# Patient Record
Sex: Female | Born: 1935
Health system: Southern US, Community
[De-identification: ages and names within clinical notes are randomized; demographics above are authoritative.]

## PROBLEM LIST (undated history)

## (undated) DIAGNOSIS — I480 Paroxysmal atrial fibrillation: Secondary | ICD-10-CM

## (undated) DIAGNOSIS — I10 Essential (primary) hypertension: Secondary | ICD-10-CM

## (undated) DIAGNOSIS — Z9289 Personal history of other medical treatment: Secondary | ICD-10-CM

## (undated) DIAGNOSIS — M810 Age-related osteoporosis without current pathological fracture: Secondary | ICD-10-CM

## (undated) HISTORY — DX: Age-related osteoporosis without current pathological fracture: M81.0

## (undated) HISTORY — DX: Paroxysmal atrial fibrillation: I48.0

## (undated) HISTORY — DX: Personal history of other medical treatment: Z92.89

## (undated) HISTORY — DX: Essential (primary) hypertension: I10

---

## 1997-11-02 ENCOUNTER — Other Ambulatory Visit: Admission: RE | Admit: 1997-11-02 | Discharge: 1997-11-02 | Payer: Self-pay | Admitting: Family Medicine

## 1999-07-24 ENCOUNTER — Ambulatory Visit (HOSPITAL_COMMUNITY): Admission: RE | Admit: 1999-07-24 | Discharge: 1999-07-24 | Payer: Self-pay | Admitting: Gastroenterology

## 1999-07-24 ENCOUNTER — Encounter (INDEPENDENT_AMBULATORY_CARE_PROVIDER_SITE_OTHER): Payer: Self-pay | Admitting: Specialist

## 2000-04-03 ENCOUNTER — Other Ambulatory Visit: Admission: RE | Admit: 2000-04-03 | Discharge: 2000-04-03 | Payer: Self-pay | Admitting: Family Medicine

## 2000-04-07 ENCOUNTER — Encounter: Admission: RE | Admit: 2000-04-07 | Discharge: 2000-04-07 | Payer: Self-pay | Admitting: Family Medicine

## 2000-04-07 ENCOUNTER — Encounter: Payer: Self-pay | Admitting: Family Medicine

## 2000-04-28 DIAGNOSIS — Z9289 Personal history of other medical treatment: Secondary | ICD-10-CM

## 2000-04-28 HISTORY — DX: Personal history of other medical treatment: Z92.89

## 2002-07-15 ENCOUNTER — Encounter: Admission: RE | Admit: 2002-07-15 | Discharge: 2002-07-15 | Payer: Self-pay | Admitting: Family Medicine

## 2002-07-15 ENCOUNTER — Encounter: Payer: Self-pay | Admitting: Family Medicine

## 2005-11-23 ENCOUNTER — Inpatient Hospital Stay (HOSPITAL_COMMUNITY): Admission: EM | Admit: 2005-11-23 | Discharge: 2005-12-05 | Payer: Self-pay | Admitting: Emergency Medicine

## 2005-12-01 ENCOUNTER — Ambulatory Visit: Payer: Self-pay | Admitting: Physical Medicine & Rehabilitation

## 2005-12-03 ENCOUNTER — Encounter: Payer: Self-pay | Admitting: Vascular Surgery

## 2005-12-23 ENCOUNTER — Ambulatory Visit (HOSPITAL_COMMUNITY): Admission: RE | Admit: 2005-12-23 | Discharge: 2005-12-23 | Payer: Self-pay | Admitting: Family Medicine

## 2006-04-05 ENCOUNTER — Inpatient Hospital Stay (HOSPITAL_COMMUNITY): Admission: EM | Admit: 2006-04-05 | Discharge: 2006-04-08 | Payer: Self-pay | Admitting: *Deleted

## 2006-04-06 ENCOUNTER — Ambulatory Visit: Payer: Self-pay | Admitting: Physical Medicine & Rehabilitation

## 2007-06-05 IMAGING — CT CT HEAD W/O CM
6 of 10 series · 18 of 47 positions shown, 19 images · IV contrast (agent unspecified)
Comparison: None.

CLINICAL DATA: Fell.
HEAD CT WITHOUT CONTRAST:
TECHNIQUE: Contiguous axial images were obtained from the base of the skull through the vertex according to standard protocol without contrast.
TECHNIQUE: Multidetector CT imaging of the cervical spine was performed.  Multiplanar CT  image reconstructions were also generated.

[Series 4: recon 3: brain · axial · 0.47mm/px · z∈[+182,+239]mm · 2 of 64 slices shown (1 of 2)]
[im 22/64  brain]
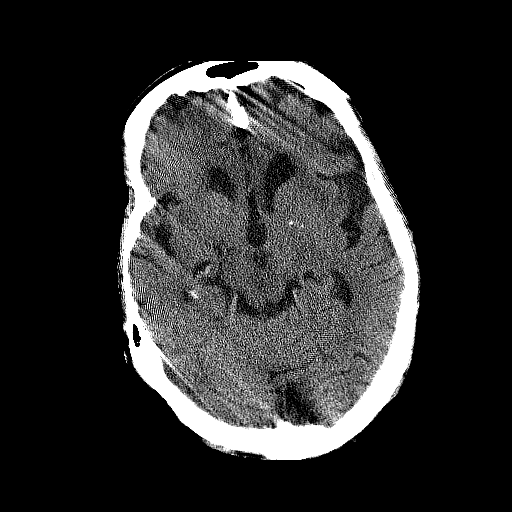
[im 43/64  brain]
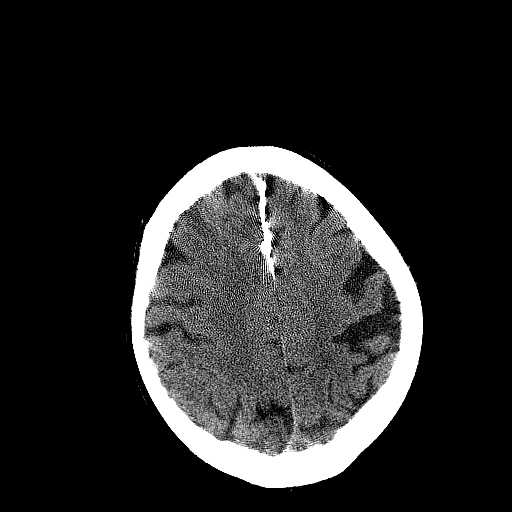

[Series 7: recon 3: brain · axial · 0.47mm/px · z∈[+157,+248]mm · 3 of 72 slices shown, 4 images (2 of 2)]
[im 18/72  brain]
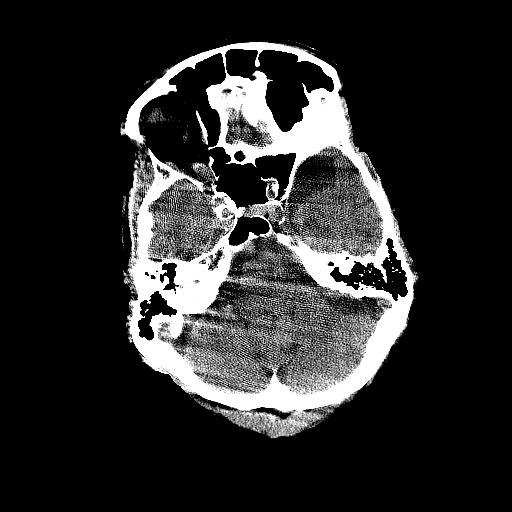
[im 18/72  bone]
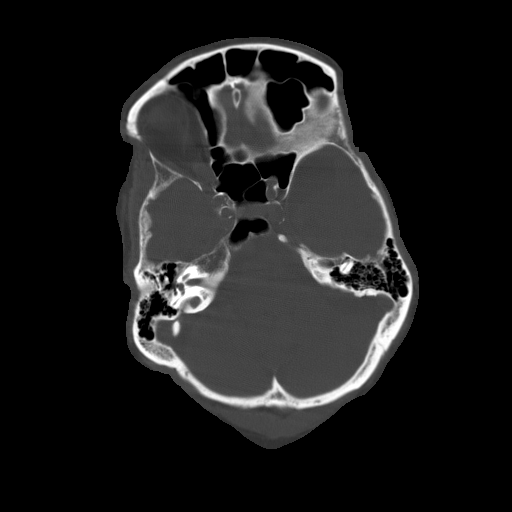
[im 36/72  brain]
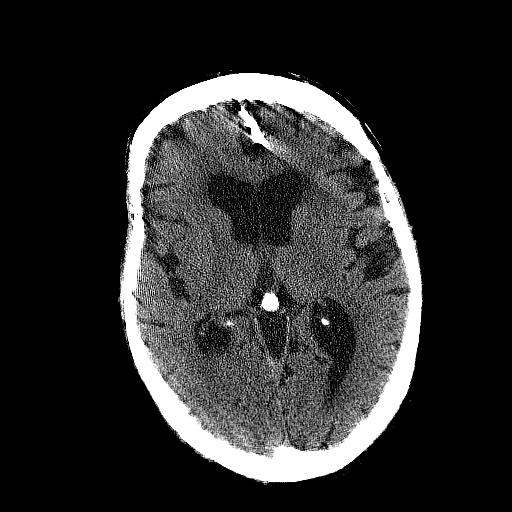
[im 54/72  brain]
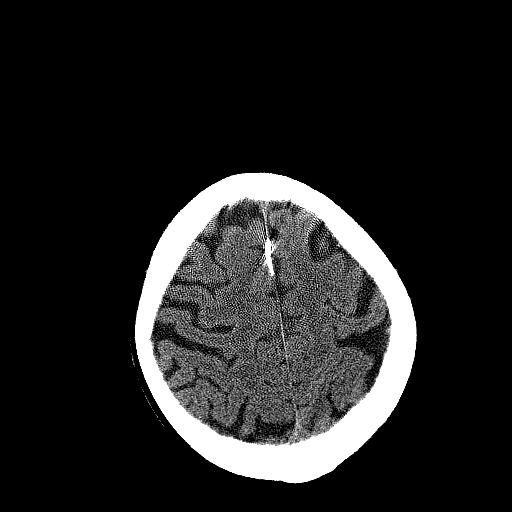

[Series 8: cervical spine · axial · 0.31mm/px · z∈[-37,+83]mm · 4 of 80 slices shown]
[im 16/80  brain]
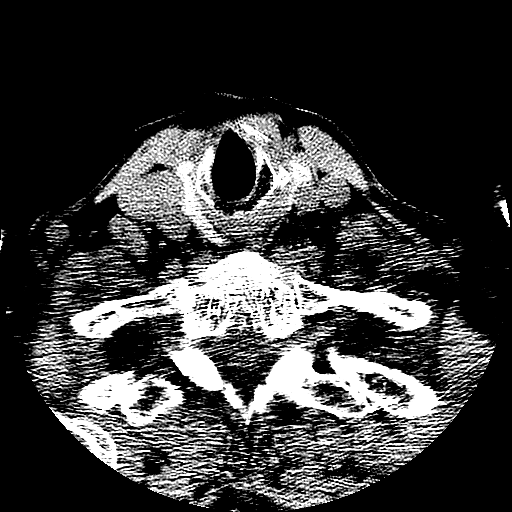
[im 32/80  brain]
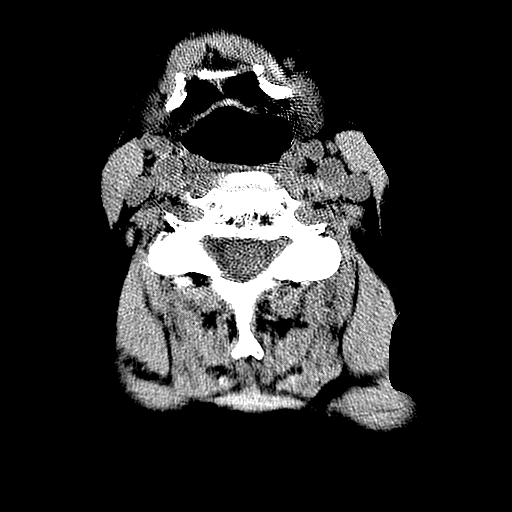
[im 48/80  brain]
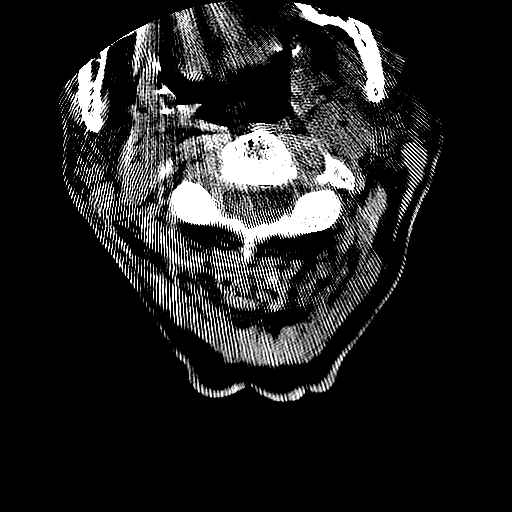
[im 64/80  brain]
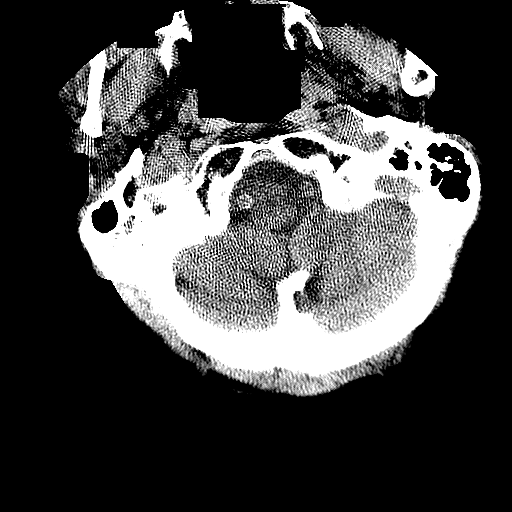

[Series 9: recon 2: cervical spine · axial · 0.31mm/px · z∈[-37,+43]mm · 3 of 80 slices shown]
[im 16/80  brain]
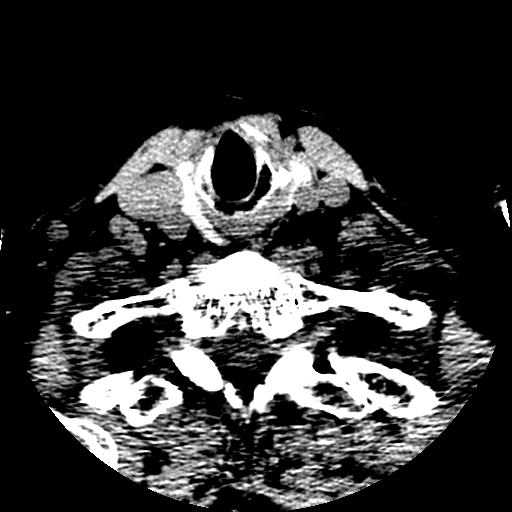
[im 32/80  brain]
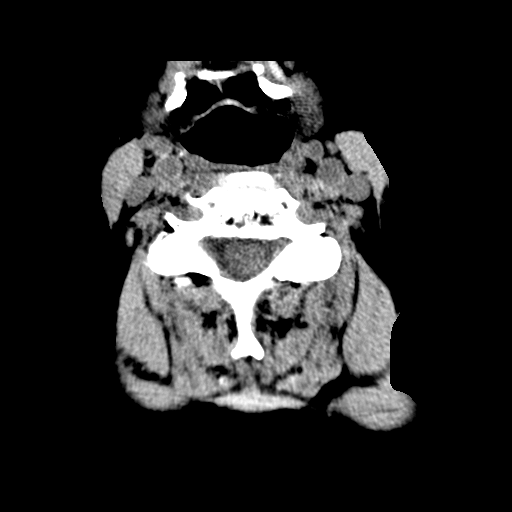
[im 48/80  brain]
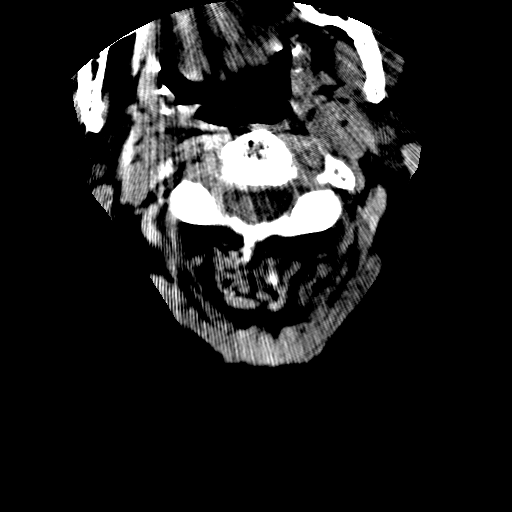

[Series 1000: reformatted · coronal · 0.40mm/px · 3 of 43 slices shown (1 of 2)]
[im 15/43  brain]
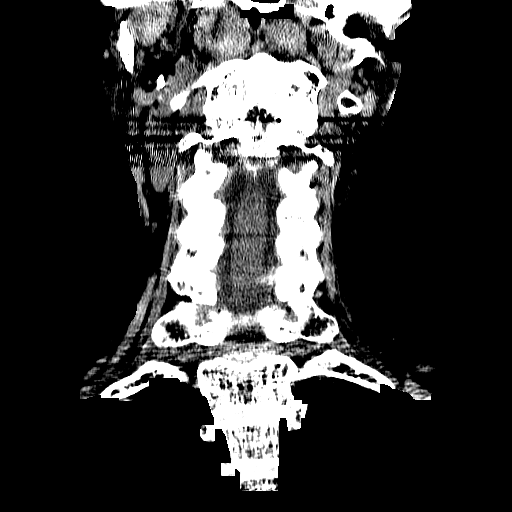
[im 19/43  brain]
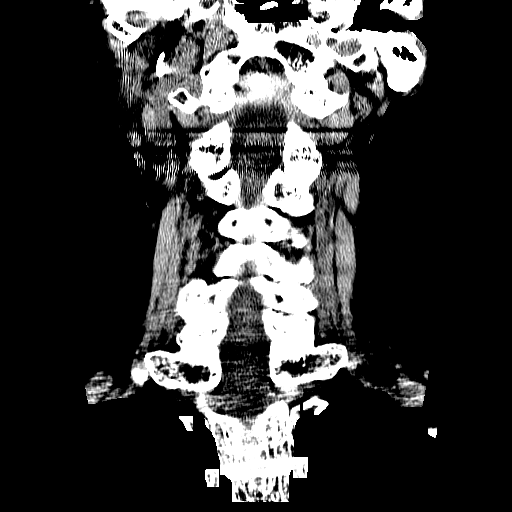
[im 24/43  brain]
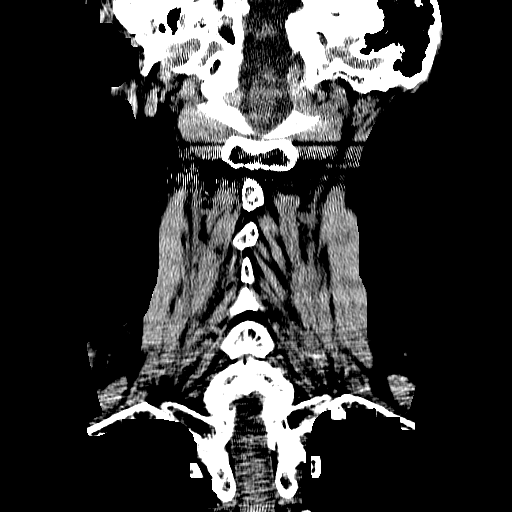

[Series 1001: reformatted · sagittal · 0.40mm/px · 3 of 40 slices shown (2 of 2)]
[im 14/40  brain]
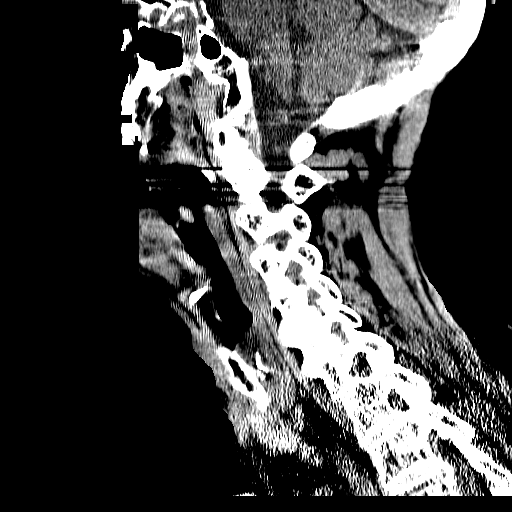
[im 20/40  brain]
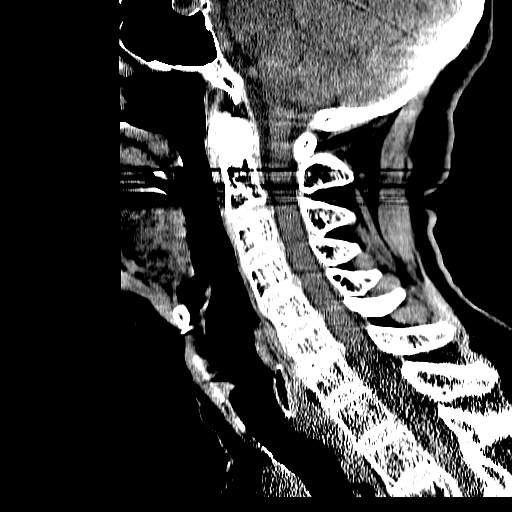
[im 27/40  brain]
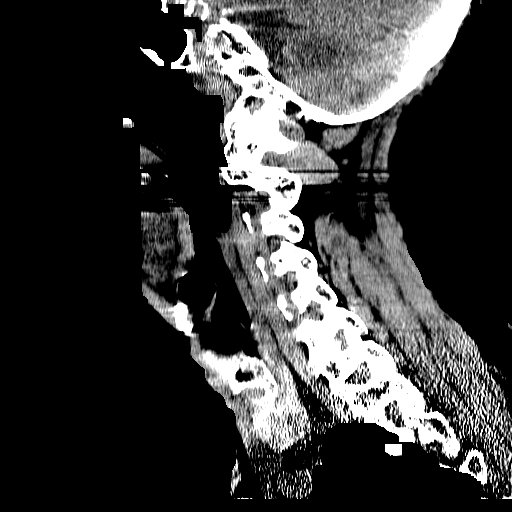

[18 of 47 positions shown; findings below may reference images not displayed]

FINDINGS: Atrophy with chronic microvascular ischemic change noted.  Bilateral basal ganglia calcification is physiologic.  There is no definite acute stroke, intracranial hemorrhage, or mass effect.  No extraaxial fluid collections are seen.  There is mild motion degradation, but I believe the calvarium is intact and the skull base is similarly free of fractures.  The paranasal sinuses are grossly clear as are the mastoids.  There is advanced vascular calcification throughout.
IMPRESSION: 1.  Atrophy with chronic microvascular ischemic change. 
2.  No definite acute intracranial findings.  
CERVICAL SPINE CT WITHOUT CONTRAST:
FINDINGS: Advanced skeletal osteopenia is present.  There is markedly coarsened trabeculae.  There is advanced degenerative change at C6-7.  There is no visible fracture or subluxation.  Prevertebral soft tissue swelling is present.  No intraspinal hematoma is seen.  There is a large uncinate spur at C5-6 on the left.  Smaller spurs are seen at C6-7 bilaterally.  Lung apices are clear.  There are no obvious neck masses.
IMPRESSION: 1.  No acute findings.  
2.  Degenerative disc disease.

## 2007-06-05 IMAGING — CR DG SHOULDER 2+V*L*
2 series · 2 of 2 positions shown · non-contrast
Comparison: none

CLINICAL DATA: Fall, left shoulder deformity.
 LEFT SHOULDER ? 2 VIEWS:

[view not recorded (1 of 2)]
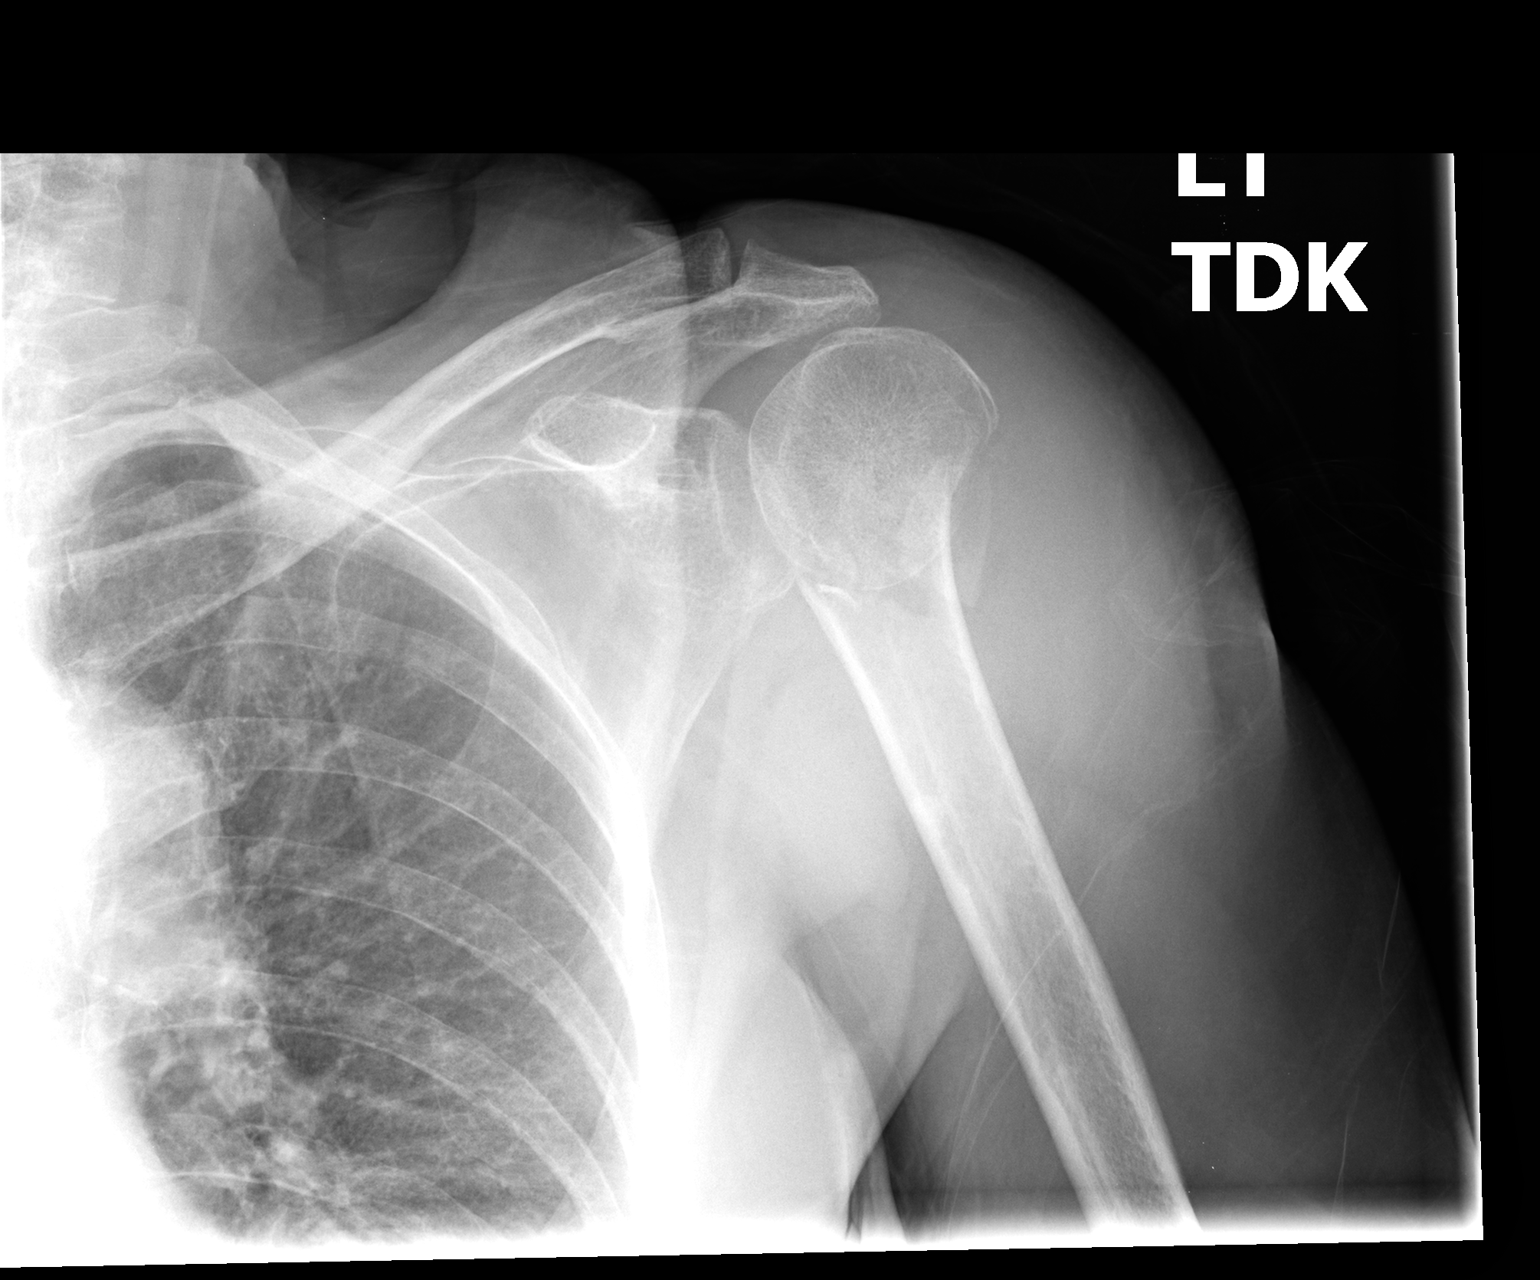

[view not recorded (2 of 2)]
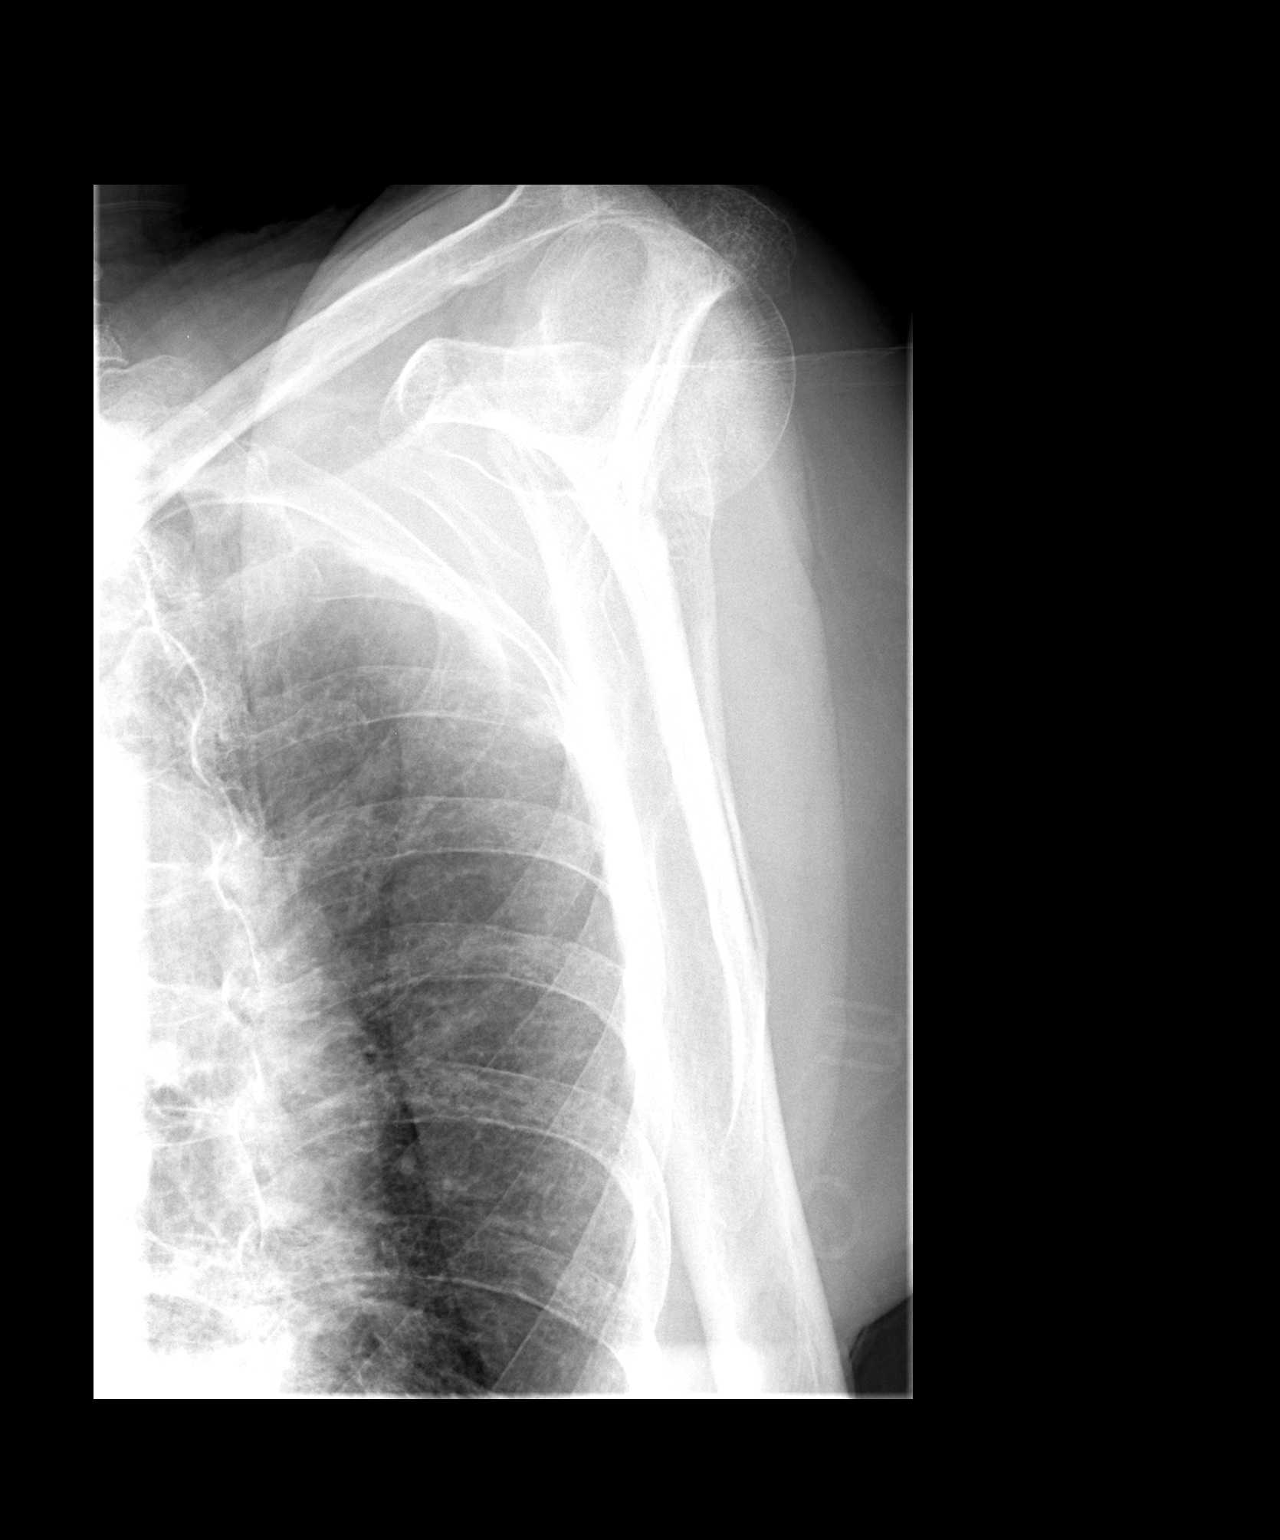

[2 of 2 positions shown; findings below may reference images not displayed]

FINDINGS: There is an impacted and possibly comminuted humeral neck fracture.  No dislocation is seen.  Osteopenia is present.  Adjacent ribs are intact.  Degenerative change is seen at the acromioclavicular joint.
IMPRESSION: Impacted and comminuted left humeral neck fracture without definite dislocation.

## 2007-09-17 ENCOUNTER — Encounter: Admission: RE | Admit: 2007-09-17 | Discharge: 2007-09-17 | Payer: Self-pay | Admitting: Family Medicine

## 2008-01-28 DIAGNOSIS — Z9289 Personal history of other medical treatment: Secondary | ICD-10-CM

## 2008-01-28 HISTORY — DX: Personal history of other medical treatment: Z92.89

## 2008-12-23 ENCOUNTER — Emergency Department (HOSPITAL_COMMUNITY): Admission: EM | Admit: 2008-12-23 | Discharge: 2008-12-23 | Payer: Self-pay | Admitting: Emergency Medicine

## 2009-08-14 ENCOUNTER — Encounter: Admission: RE | Admit: 2009-08-14 | Discharge: 2009-08-14 | Payer: Self-pay | Admitting: Orthopedic Surgery

## 2010-11-01 NOTE — Procedures (Signed)
[name]. [facility]  Patient:    [name], [name]                   MRN: [id number] Proc. Date: 07/24/99 Adm. Date:  [id number] Attending:  Rich Brave CC:         Talmadge Coventry, M.D.                           Procedure Report  PROCEDURE PERFORMED:  Upper endoscopy.  ENDOSCOPIST:  Florencia Reasons, M.D.  INDICATIONS FOR PROCEDURE:  The patient is a 77 year old female on aspirin with  heme positive stool and mild reflux symptoms.  FINDINGS:  Essentially normal exam.  No aspirin induced gastropathy.  DESCRIPTION OF PROCEDURE:  The nature, purpose and risks of the procedure had been discussed with the patient, who provided written consent.  Sedation was fentanyl 60 mcg and Versed 6 mg IV without arrhythmias or desaturation.  The Olympus video endoscope was passed under direct vision.  The esophagus waas entered without much difficulty.  The esophageal mucosa was normal, specifically without evidence of reflux esophagitis or Barretts mucosa.  No varices, infection, neoplasia, ring, stricture or hiatal hernia was appreciated.  There did appear to be some slight spasm in the distal esophagus making it slightly difficult to pass the scope through that area, but no discrete ring or stricture could be appreciated.  The stomach was entered.  It contained no significant residual.  There were a couple of tiny mucosal hemorrhages in the antrum of the stomach but no erosive changes.  There was some faint erythema of the gastric antrum in a watermelon pattern but this was not felt to really be pathologic or significant.  No source of heme positivity was identified, specifically, no erosions or ulcers, nor did I ee any polyps or masses including a retroflex view of the proximal stomach which was normal.  It should be recalled, however, that the patient had been off her aspirin for about five days by the time of this exam, thus  allowing potentially an opportunity for aspirin related mucosal inflammation to improve.  The pylorus, duodenal bulb and second duodenum looked normal.  The scope was removed from the patient who tolerated the procedure well and without apparent complication.  IMPRESSION: 1. Essentially normal upper endoscopy. 2. No source of heme positive stool identified, but suspect that transient    gastric inflammation from aspirin use could be occurring since the patient    had been off aspirin for five days at the time of this procedure. 3. No definite source of dysphagia identified but "tight" muscle in the distal    esophagus may be playing a role in that symptom which is not too bothersome    for the patient. 4. The patient has some mild reflux symptoms but I did not see evidence of reflux    induced esophageal mucosal injury.  PLAN:  Proceed to colonoscopic evaluation.  May continue to use Pepcid p.r.n. for reflux. DD:  07/24/99 TD:  07/24/99 Job: [id number]

## 2010-11-01 NOTE — Procedures (Signed)
[place]. [facility]  Patient:    [name], [name]                   MRN: [id number] Proc. Date: 07/24/99 Adm. Date:  [id number] Attending:  Rich Brave CC:         Talmadge Coventry, M.D.                           Procedure Report  PROCEDURE PERFORMED:  Colonoscopy with polypectomy.  ENDOSCOPIST:  Florencia Reasons, M.D.  INDICATIONS FOR PROCEDURE:  The patient is a 77 year old female with heme positive stool noted through her primary physicians office. She is several years status post colonoscopic polypectomy.  FINDINGS:  Medium sized polyp removed from the midcolon and tiny rectal polyp present.  DESCRIPTION OF PROCEDURE:  The nature, purpose and risks of the procedure were familiar to the patient from prior examination and she provided written consent. This procedure was performed immediately following her upper endoscopy for which she had received sedation with fentanyl 60 mcg and Versed 6 mg IV without arrhythmias or desaturation during either procedure.  No additional sedation was required for this procedure.  The Olympus adult video colonoscope was advanced around the colon to the area just above the cecum.  There was quite a bit of looping which could not be overcome ven with external abdominal compression and placing the patient in the supine and right lateral decubitus positions.  I used a biopsy forceps to help tease the mucosa f the base of the cecum into a more visible orientation so it is felt that most of the cecal surface area was able to be seen and that no significant mass lesions  would have been missed in that area.  Pullback was then performed.  During insertion of the scope, I encountered a medium-sized (5 x 6 mm) polyp in the region of the midcolon, perhaps near the splenic flexure.  This was removed by snare technique with complete hemostasis nd no evidence of excessive cautery.  I had about 80 cm of  scope insertion at the ime of the polypectomy but due to looping, the anatomic correlation of this location is unclear and I did not see the eschar site during pullback.  There was also a tiny sessile polyp in the midrectum at about 8 cm removed by a  single cold biopsy.  No evidence of cancer, colitis or vascular malformations were observed.  There was some slight sigmoid diverticulosis.  Retroflexion in the rectum disclosed no distal rectal abnormalities.  The patient tolerated the procedure well and there were no apparent complications.  IMPRESSION: 1. Colon polyps removed as described above. 2. Sigmoid diverticulosis. 3. Suboptimal visualization of the cecum.  PLAN:  Await pathology on the polyp.  Would anticipate colonoscopic follow-up probably about three years from now. DD:  07/24/99 TD:  07/24/99 Job: [id number]

## 2010-11-01 NOTE — Consult Note (Signed)
[name], [name]            ACCOUNT NO.:  [id number]   MEDICAL RECORD NO.:  [id number]          PATIENT TYPE:  INP   LOCATION:  5003                         FACILITY:  [facility]   PHYSICIAN:  Lonia Blood, M.D.      DATE OF BIRTH:  1923/03/14   DATE OF CONSULTATION:  11/27/2005  DATE OF DISCHARGE:                                   CONSULTATION   PRIMARY CARE PHYSICIAN:  Dr. Talmadge Coventry.   CONSULT REQUESTED BY:  Dr. Durene Romans.   REASON FOR CONSULTATION:  Hypoxia and for medical management.   HISTORY OF PRESENT ILLNESS:  The patient is an 77 year old white female that  apparently slid and fell on November 23, 2005, and sustained a left displaced  femoral neck fracture.  She was admitted by orthopedics and had a left  hemiarthroplasty performed.  The patient has been doing fine afterwards and  was mainly pain control in the beginning.  Since yesterday, however, she was  noted to have some elements of hypoxia.  She had initial workup including a  chest x-ray and chest CT that suggests bilateral lower lobe collapse versus  consolidation, also some mild pleural effusion.  With that, we have been  asked to consult on the patient.  She is currently doing better in terms of  her breathing.  She is non-having any labored breathing and no chest pain.  She denied any cough.  The patient also denied any chest pain and no fever  at this point.  She is still feeling not too good.  She has been off pain  medicine for the most part.   PAST MEDICAL HISTORY:  Significant for hypertension, osteoporosis, anxiety  disorder and of the left femoral fracture.   ALLERGIES:  THE PATIENT IS ALLERGIC TO BACTRIM AND PHENOBARBITAL.   MEDICATIONS:  Currently on:  1.  Aspirin 81 mg daily.  2.  She is on Coumadin for DVT prophylaxis.  3.  Colace 100 mg b.i.d.  4.  Ferrous sulfate 325 mg t.i.d.  5.  Hydrochlorothiazide 25 mg daily.  6.  Benazepril 20 mg daily.  7.  Pimelo 75 mg q.h.s.  8.  Lorcet  p.r.n. 500 mg.   SOCIAL HISTORY:  The patient is married and lives with her husband.  Her  husband was a former smoker to quit about 30 years ago.  Patient has never  smoked although she has been exposed to secondhand smoke.  Usually, very  functional and a very active prior to this.  Also, no alcohol use.   FAMILY HISTORY:  Mother died at age of 41 from Alzheimer's.  Father died at  the age of 109 from Bright's disease.   REVIEW OF SYSTEMS:  A 12-point review of systems is performed a day and is  significant only for some diarrhea this morning.  Otherwise, by HPI.   EXAMINATION:  VITAL SIGNS:  On her exam today, temperature is 98.  Of note,  she has had a temperature of 101.2 back on June 12.  Her blood pressure  ranges between 91-124 systolic with a diastolic of 52.  Pulse rate  110-120.  Respiratory rate 20.  Her sats currently are 97% on 2 L up from 92% on 2 L 2  days ago.  GENERALLY:  The patient is awake, alert and oriented in no acute distress.  HEENT:  PERRL.  EOMI.  NECK:  Supple.  No JVD.  No lymphadenopathy.  RESPIRATORY:  The patient has good air entry bilaterally.  No wheezes.  No  rales.  CARDIOVASCULAR SYSTEM:  She has a regular rate and rhythm.  ABDOMEN:  Soft and non-tender with positive bowel sounds.  EXTREMITIES:  Left lower extremity status post surgery and immobilized.  No  edema, cyanosis or clubbing.   LABORATORY:  Currently, no labs today.  The patient's white count yesterday  was 21.6, hemoglobin 9.3 and platelet count 197.  No differentials checked.  Sodium was 128, potassium 3.3, chloride 92, CO2 26, glucose 162, BUN 12,  creatinine 1.1 and calcium 7.8.  Chest x-ray performed today showed possible  COPD, emphysema and lower lung volumes.  There was a right mid-lung and left  apical sub-segmented atelectasis.  A chest CT without contrast today showed  no pulmonary lower low collapse versus consolidation, also bilateral pleural  effusions.   ASSESSMENT:   Therefore, an 77 year old female with sudden onset of hypoxia,  which has been improving right now.  This happened postoperatively and at  the time when patient was probably on some pain medications.  Her symptoms  have since improved tremendously.  I, therefore, suspect that patient's  symptoms are more likely poor inspiratory efforts both from her use of  medications and postoperatively.  Pneumonia, however, cannot be ruled out  especially with patient having a temperature of 101.2, as well as her  symptoms.  Other medical problems included hypokalemia, hyponatremia and  anemia blood loss.   PLAN AND RECOMMENDATIONS:  Therefore will be:  1.  Begin empiric antibiotics with Levaquin probably.  At the same time, I      agree with drawing blood cultures.  We may check UA also on patient.      Once studies performed, continue with incentive spirometry and chest x-      ray.  2.  I recommend p.r.n. nebulizers while in the hospital.  Also, replete her      potassium and give gentle hydration.  We may consider stopping her      hydrochlorothiazide at this point because of her hyponatremia and to use      other means of treatment for her high blood pressure.  3.  Would also follow up with you while patient is in the hospital.   Thank you for the consult.      Lonia Blood, M.D.  Electronically Signed     LG/MEDQ  D:  11/27/2005  T:  11/27/2005  Job:  [id number]   cc:   Talmadge Coventry, M.D.  Fax: [phone]

## 2010-11-01 NOTE — H&P (Signed)
[name], [name] NO.:  [id number]   MEDICAL RECORD NO.:  [id number]          PATIENT TYPE:  INP   LOCATION:  5023                         FACILITY:  [facility]   PHYSICIAN:  Della Goo, M.D. DATE OF BIRTH:  03/01/1923   DATE OF ADMISSION:  04/05/2006  DATE OF DISCHARGE:                                HISTORY & PHYSICAL   CHIEF COMPLAINT:  Left hip and left shoulder pain status post fall.   HISTORY OF PRESENT ILLNESS:  [age]year-old female who was brought to  the emergency department after suffering a fall the evening of admission.  She reports walking and reaching out to open a door and lost her balance and  fell onto her left side, injuring her left shoulder and left hip.  Patient  has a history of a left hip hemiarthroplasty performed in June of 2007,  following a previous femoral neck fracture at that time.  Patient denies  having any chest pain, shortness of breath, dizziness, syncope, nausea,  vomiting, diarrhea, fevers or chills associated with this event.   PAST MEDICAL HISTORY:  As mentioned above.  Also, hypertension and  osteoporosis history.   MEDICATIONS:  Include:  1. Lotensin 5 mg one p.o. daily.  2. Aspirin 325 mg one p.o. daily.   ALLERGIES:  TO:  1. PHENOBARBITAL CAUSING ANAPHYLAXIS.  2. BACTRIM CAUSING RASH.   SOCIAL HISTORY:  Patient lives in the home of her adult son.  She is a  nonsmoker, nondrinker.   PHYSICAL EXAMINATION FINDINGS:  GENERAL:  [age]-old well-  nourished, well-developed female in discomfort, but in no acute distress.  VITAL SIGNS:  On admission revealed a temperature of 97.2, blood pressure  173/86, heart rate 90, respirations 18.  O2 saturations is 92%.  HEENT:  Normocephalic, atraumatic.  No palpable scalp hematomas or nodules.  Pupils equally round and reactive to light.  Extraocular muscles are intact.  No scleral icterus.  Oropharynx clear.  Poor dentition.  NECK:  Supple.  Full range of  motion.  No thyromegaly, adenopathy or jugular  venous distention.  CARDIOVASCULAR:  Regular rate and rhythm.  LUNGS:  Clear to auscultation bilaterally.  ABDOMEN:  Positive bowel sounds.  Soft, nontender, nondistended.  EXTREMITIES:  Without edema.  Dorsal pedal pulses intact bilaterally, 2+.  Patient has full range of motion, however, does have pain with movement of  her left shoulder and left hip.   LABORATORY STUDIES:  Revealed a hemoglobin of 13.3, hematocrit 39.0, sodium  140, potassium 3.8, chloride 109, bicarbonate 21.7, BUN 21, creatinine 0.9,  glucose 99.  Protime 14.3, INR 1.1.  Pelvis x-ray revealed a fibroid uterus,  which is introverted.  X-rays of the left hip reveal an antecub inferior  ramus fracture.  X-rays of the left elbow are without evidence of fracture.  X-ray of the left shoulder reveals a left humeral neck fracture, which is  nondisplaced an comminuted.  CT of the head reveals no acute findings of  intracranial hemorrhage.  However, it does reveal changes of atrophy and  small vessel changes.   ASSESSMENT:  [age]year-old female who is status post fall with  acute  fractures of the left hip and left humerus.   ADMITTING DIAGNOSES:  Include:  1. Left hip fracture, inferior ramus fracture.  2. Left humeral fracture.  3. History of hypertension.  4. History of osteoporosis.   PLAN:  Patient will be admitted for conservative therapy as per the  orthopedic evaluation performed by Dr. Turner Daniels.  Pain control therapy will be  initiated with IV Dilaudid p.r.n.  Patient will also be placed on GI and DVT  prophylaxis and will continue on her regular medications.  Her blood  pressure is elevated, at this time, which is thought to be reactive to pain.  However, a p.r.n. clonidine order will be given.      Della Goo, M.D.  Electronically Signed     HJ/MEDQ  D:  04/05/2006  T:  04/05/2006  Job:  [id number]   cc:   Talmadge Coventry, M.D.

## 2010-11-01 NOTE — H&P (Signed)
NAMEJAZMAINE, [name] NO.:  [id number]   MEDICAL RECORD NO.:  [id number]          PATIENT TYPE:  EMS   LOCATION:  MAJO                         FACILITY:  [facility]   PHYSICIAN:  Madlyn Frankel. Charlann Boxer, M.D.  DATE OF BIRTH:  09-Mar-1923   DATE OF ADMISSION:  11/23/2005  DATE OF DISCHARGE:                                HISTORY & PHYSICAL   PRINCIPAL DIAGNOSIS:  Left displaced femoral neck fracture.   SECONDARY DIAGNOSES:  1.  Hypertension.  2.  Anxiety.  3.  Osteoporosis.   HISTORY OF PRESENT ILLNESS:  [name] is a very pleasant 77 year old  female who is here in the emergency room for a fall that she sustained when  she was at [name]'s graduation at [place], being carried out at the  current [employer].  She stumbled on a last step and is uncertain  exactly what happened but no evidence of any shortness of breath, blacking  out, or dizziness prior to the fall.  She landed more on Ashley left buttock  area as opposed to Ashley left side and did not hurt Ashley wrist, shoulder, or  elbow.  Does have a little bit of back pain.  This was seen and evaluated by  the emergency room physician.  No radiographs were ordered.   Nonetheless, she had immediate onset of pain in the left hip with the  inability to bear weight and was brought to the emergency room by Ashley  family.  Ashley daughter-in-law is a Engineer, civil (consulting) at the [employer] [facility] [employer] [facility] outpatient  surgical center.  Ashley primary complaint is of pain in Ashley left proximal  thigh.  Again, no other injuries reported other than the back pain.   PAST MEDICAL HISTORY:  1.  Hypertension.  2.  Anxiety.  3.  Osteoporosis.   FAMILY HISTORY:  Noncontributory.  Ashley mother did live until she was 26 and  had a history of Alzheimer's since age 84.  Father died at 54 for Bright's  disease.   SOCIAL HISTORY:  She currently lives with Ashley husband.  She is very  functional, performing normal activities, including aerobics.  She has been  doing  that for six years.  No smoking or alcohol history.   DRUG ALLERGIES:  1.  BACTRIM.  2.  PHENOBARBITAL.   CURRENT MEDICATIONS:  Hydrochlorothiazide, aspirin, nortriptyline, Actonel,  and one other antihypertensive which she cannot recall the name of.   PRIMARY CARE PHYSICIAN:  Talmadge Coventry, M.D.   REVIEW OF SYSTEMS:  Otherwise negative for HPI issues.   PHYSICAL EXAMINATION:  VITAL SIGNS:  Blood pressure 156/80, pulse 86,  temperature 97.9, respirations 20.  GENERAL:  [name] is a very pleasant 77 year old female, relatively  comfortable in the bed other than the pain in the left hip.  She is awake,  alert and oriented.  HEENT:  No slurred speech.  No ocular dysfunction.  NECK:  Supple.  CHEST:  Clear to auscultation bilaterally.  HEART:  Regular rate and rhythm.  No murmurs.  ABDOMEN:  Soft and nontender.  EXTREMITIES:  Examination of the lower extremities reveals she has pain with  any external or internal rotation of the left hip.  This is limited.  She  otherwise is neurovascularly stable.  The right hip is normal.  BACK:  Examination of Ashley back was carried out to the ER and deferred to  later examination from my standpoint until the hip is fixed.   Radiographs revealed that she has a displaced varus angulated femoral neck  fracture on the left.  She has osteoporosis based on bone quality and  fracture.   Labs were pending from the operating room.   IMPRESSION:  Left displaced femoral neck fracture.   PLAN:  I reviewed with [name] and Ashley family, including Ashley daughter-  in-law and sons, the diagnosis and treatment options.  The treatment options  in an 77 year old female would be the possibility of hemiarthroplasty versus  total hip replacement.  Based on our discussion today, including the risks  and benefits of infection, dislocation, DVT, persistent discomfort, or  acetabular pain, the plan would be to proceed with a cemented left hip   hemiarthroplasty.  I do feel that this would provide Ashley with a good long  term result and allow Ashley to function sooner than later.  I discussed the  postoperative course and expectations.  She is very motivated to return back  to Ashley normal state of health and life.  I think that this will be the best  way to do this.   We will also evaluate for any potential persistent thoracic lumbar pain in  the form of compression fracture if she has persistent discomfort.  Home  medications will be obtained and provided to the nursing staff to the home  medical reconciliation form.  She will receive Ancef prior to the OR, and  consent will be obtained based off Ashley discussion here.      Madlyn Frankel Charlann Boxer, M.D.  Electronically Signed     MDO/MEDQ  D:  11/23/2005  T:  11/23/2005  Job:  [id number]   cc:   Talmadge Coventry, M.D.  Fax: [phone]

## 2010-11-01 NOTE — Discharge Summary (Signed)
[name], [name] NO.:  [id number]   MEDICAL RECORD NO.:  [id number]          PATIENT TYPE:  INP   LOCATION:  5023                         FACILITY:  [facility]   PHYSICIAN:  Michaelyn Barter, M.D. DATE OF BIRTH:  Jul 09, 1922   DATE OF ADMISSION:  04/04/2006  DATE OF DISCHARGE:  04/08/2006                                 DISCHARGE SUMMARY   FINAL DIAGNOSES:  1. Falling.  2. Impacted and comminuted left humeral neck fracture without definite      dislocation  3. Left inferior pubic ramus fracture, likely acute.   SECONDARY DIAGNOSIS:  Recent left hip fracture.   PROCEDURES.:   1. X-ray of the left elbow and shoulder, completed on      October20,2007.  2. A CT scan of the head without contrast, completed October20,2007.  3. CT scan of the spine without contrast, October20,2007.  4. X-ray of the hip, left side, October20,2007.  5. X-ray of the pelvis, completed October20,2007.   CONSULTATIONS:  Orthopedic surgery.   HISTORY OF PRESENT ILLNESS:  Ms. Worm is an 77 year old female who fell  on the date of admission.  She arrived with a chief complaint of left hip  and left shoulder pain following her fall.  She stated that she had walked  and stopped open a door and lost her balance, and fell onto her left side,  injuring her left shoulder and left hip.   PAST MEDICAL HISTORY:  Please see that dictated by Dr. Della Goo on  October21,2007.   HOSPITAL COURSE:  1. Falling.  The patient stated that she hit her head in the fall.  She      essentially lost balance.  She typically uses a cane to ambulate,      however, on the date of her admission she did not use her cane.  A CT      scan of the patient's head was completed without contrast on April 04, 2006.  It revealed atrophy with chronic microvascular ischemic      change.  No definite acute intracranial findings were seen.  CT scan of      the cervical spine was also done and it revealed no  acute findings,      degenerative disk disease was seen.  2. Impacted and comminuted left humeral neck fracture without definite      dislocation.  The patient complained of some left-sided pain.  An x-ray      was completed of the left shoulder and it confirmed and impacted and      comminuted left humeral neck fracture.  Orthopedic surgery was      consulted and they recommended conservative therapy.  They also      recommended non-weightbearing to the left upper extremity.  Therefore,      the patient was treated with p.r.n. pain medications.  3. Left inferior pubic ramus fracture.  This was confirmed on x-rays of      the patient's hip, again orthopedic surgery evaluated this.  They      indicated that weightbearing with physical therapy  should be done      regarding the left lower extremity.  They stated that the patient      should use a walker with assistance.  Physical therapy has followed the      patient throughout the course of her hospitalization and they should      continue to do so once she is discharged from the hospital.  4. Hypertension.  This has been stable throughout the course of the      hospitalization.  Today, October24, 2007, the patient indicates that      she feels pretty good.  She states that she has some pain in her left      hip, particularly when she tries to apply weight to that area.      Otherwise, she does not have any other complaints.   VITAL SIGNS:  Today, her temperature is 97.4, heart rate 97, respirations  20, blood pressure 125/71, O2 sat is 94% on room air.   The decision has been made to discharge the patient from the hospital.   The patient's medications at the time of discharge will consist of:  1. Aspirin 81 mg p.o. every day.  2. Lotensin 5 mg p.o. every day.  3. Pamelor 75 mg p.o. q.h.s.  4. Lovenox 40 mg subcu every day.  5. Protonix 40 mg p.o. every day.  6. Senokot-S 1 tablet p.o. q.h.s.  7. Tylenol 650 mg p.o. q.4 h p.r.n.  8.  Oxycodone 5 mg p.o. q.4 h p.r.n.  9. Ambien 5 mg p.o. q.h.s.   The patient also had a urinalysis completed on October23, 2007,  confirming the presence of a urinary tract infection.  Therefore, she will  be started on levofloxacin 500 mg p.o. every day.  She should take this for  the next 7 days with today being the first day.      Michaelyn Barter, M.D.  Electronically Signed     OR/MEDQ  D:  04/08/2006  T:  04/08/2006  Job:  [id number]

## 2010-11-01 NOTE — Discharge Summary (Signed)
[name], [name] NO.:  [id number]   MEDICAL RECORD NO.:  [id number]          PATIENT TYPE:  INP   LOCATION:  5012                         FACILITY:  [facility]   PHYSICIAN:  Madlyn Frankel. Charlann Boxer, M.D.  DATE OF BIRTH:  12-Jun-1923   DATE OF ADMISSION:  11/23/2005  DATE OF DISCHARGE:  12/05/2005                                 DISCHARGE SUMMARY   ADMISSION DIAGNOSES:  1.  Left displaced femoral neck fracture.  2.  Hypertension.  3.  Anxiety.  4.  Osteoporosis.   DISCHARGE DIAGNOSES:  1.  Left displaced femoral neck fracture.  2.  Hypertension.  3.  Anxiety.  4.  Osteoporosis.  5.  Postoperative anemia treated with transfusion.  6.  Hyponatremia.  7.  Hypokalemia.  8.  Metabolic acidosis secondary to hyperventilation.  9.  Urinary tract infection.  10. Basilar atelectasis by chest x-ray.   OPERATION:  On 11/23/2005 the patient underwent left hip hemiarthroplasty  with component cemented.   No assistant.   BRIEF HISTORY:  This 77 year old lady was at her grandson's graduation from  school when she took a misstep on the stairs.  She fell to the left.  Her  family brought her to the emergency room, and x-rays showed a femoral neck  fracture.  She had been in relatively good health prior to this injury  except for hypertension and anxiety.  I discussed with the family, as well  as the patient, with the risks and benefits of surgery, and it was decided  to go ahead with the above procedure.   COURSE IN THE HOSPITAL:  The patient tolerated the surgical procedure quite  well.  She was placed on Coumadin postoperatively for prevention of DVT.  As  she was doing well, it was thought that she could be managed at home.  It  was noted that she did have a mild hypokalemia early on in her postoperative  day number 3.  On 11/26/2005 the patient had a drop in her pO2 at 84-86.  We  increased the O2, and we increased the IVs.  Unfortunately, the patient had  increasing  shortness of breath with hypoxia and hyponatremia and hypokalemia  and some confusion.  A UTI was also noted.  We asked for and received a  consult from the hospitalist, Dr. Mikeal Hawthorne.  He and his team continued to  follow the patient throughout her hospitalization.  She eventually was  transfused for her postoperative anemia, and this improved it somewhat.  Fluid restriction was done for the hyponatremia and the hypokalemia  responded to p.o. potassium.  She continued with the hyponatremia despite  our restricting fluids and liberalizing salt intake.  She had various  degrees of confusion.  She was placed on vancomycin for the urinary tract  infection.   Orthopedically, the patient was quite stable.  Her wound was clean and dry.  Neurovascularly, she was intact to the operated extremity.  She had only a  minimal amount of serous drainage from one part of the wound.  It was  thought she might benefit with an inpatient rehab admission; however she  was  improving at actually working with physical therapy, and she was eventually  ambulating in her room.  Dr. Lavera Guise saw the patient prior to discharge.  Felt  to continue with the Coumadin.  Was doing fair on room air.  The  leukocytosis he suspected to be a demargination.  It was felt that she did  have some mild bronchitis and was going to have her stay on Levaquin for 5  days after discharge.  Hyponatremia was be treated with continuing water  restriction and regular diet.  She will resume her antihypertensive  medications at discharge.  Dr. Lavera Guise felt that the patient could be  discharged as far as the [facility] hospitalist were concerned.  We certainly  appreciate their health.   Again, Orthopedically the patient was doing quite well as far as her overall  orthopedic status was concerned.   We will have her follow up with her family physician, Dr. Talmadge Coventry, after her discharge.   STUDIES:  Electrocardiogram showed sinus  bradycardia.  Chest x-ray showed  mild basilar atelectasis.  No pneumonia was noted.  Spiral CT showed no  acute pulmonary embolus.  Incidentally, the chest x-ray showed a T8  compression fracture, as well.   LABORATORY EVALUATION:  Laboratory evaluation in the hospital showed a  preoperative hemoglobin at 13.9 and hematocrit was 41.  This dropped down to  8.6 and after transfusion came up to 10.5.  Her final hemoglobin was 11.3  with hematocrit of 32.7, and white count with the leukocytosis as mentioned  above at 21.4.  Blood chemistry showed a preoperative sodium at 138,  potassium was 3.3.  Final sodium was 131, potassium 4.6.  Creatinine was  normal at people .7, and calcium was 8.1.  CK was 297, CK-MB was 14.1;  relative index was 4.7.  Troponin 0.04.  TSH was 3.909.  Urinalysis done on  11/26/2005 showed urinary tract infection, granular cast, many bacteria,  trace leukoesterase.  Blood cultures all with no growth x5 days.   CONDITION ON DISCHARGE:  Improved, stable.   PLAN:  The patient may participate in any activity she desires.  She may  weight bear as tolerated.  She is to continue with her pain medications in a  mild amount.  Continue with Coumadin protocol.  She is to follow up with Dr.  Talmadge Coventry per her instructions.  He turned to see Dr. Charlann Boxer about 2-  3 weeks after the date of surgery.   DICTATED BY:  Jenne Campus, P.A.      Madlyn Frankel Charlann Boxer, M.D.  Electronically Signed     MDO/MEDQ  D:  01/02/2006  T:  01/02/2006  Job:  [id number]   cc:   Dr. Talmadge Coventry

## 2010-11-01 NOTE — Op Note (Signed)
[name], [name] NO.:  [id number]   MEDICAL RECORD NO.:  [id number]          PATIENT TYPE:  INP   LOCATION:  1827                         FACILITY:  [facility]   PHYSICIAN:  Madlyn Frankel. Charlann Boxer, M.D.  DATE OF BIRTH:  [birth year]   DATE OF PROCEDURE:  11/23/2005  DATE OF DISCHARGE:                                 OPERATIVE REPORT   PREOPERATIVE DIAGNOSIS:  Displaced left femoral neck fracture.   POSTOPERATIVE DIAGNOSIS:  Displaced left femoral neck fracture.   PROCEDURE:  Left hip hemiarthroplasty, cemented; a [place] basic  fracture stem size 5 with a 46 bipolar ball, 28 +1.5 inner ball.   SURGEON:  Madlyn Frankel. Charlann Boxer, M.D.   ASSISTANT:  Surgical tech.   ANESTHESIA:  General.   BLOOD LOSS:  350.   COMPLICATIONS:  None.   DRAINS:  None.   INDICATIONS:  Ms. Struss is a pleasant 77 year old female who was at her  grandson's graduation earlier today when she misstepped on the stairs,  falling onto her left side.  She was brought to the emergency room by her  family.  Her daughter-in-law is a Engineer, civil (consulting) at the [employer].  Radiographs  were taken in the emergency room which revealed a femoral neck fracture.  Orthopedics was consulted.  Ms. [place] is otherwise fairly healthy female  with a history of hypertension and anxiety.  Discussed with the family risks  and benefits and treatment options at this point.  Basically surgical  options only available.  Open reduction, internal dictation provides very  little hope of the reliable fixation and long-term survivability.  Hemiarthroplasty versus total hip replacement was discussed.  After  discussing risks and benefits of infection, dislocation, persistent  discomfort, lateral hip pain and hip pain related to acetabular wear we  decided on hip hemiarthroplasty.   Consent was obtained.   PROCEDURE IN DETAIL:  The patient was brought to operative theater.  Once  adequate anesthesia and preoperative  antibiotics, 2 grams of Ancef, a second  dose given to be within a 1 hour window of the surgical incision, the  patient was positioned in the right lateral decubitus position with left  side up.  Left lower extremity was then prepped and draped in sterile  fashion.  A lateral based incision was made for posterior approach to the  hip.  Short external rotators were taken down separate from the posterior  capsule.  Posterior capsule was incised in a L-shaped  capsulotomy and used  to for retraction against sciatic nerve as well as to repair at end of the  case anatomically.  The fracture site was identified, corkscrew was utilized  to remove the femoral head and measured this to be a 46 mm head.   Femoral exposure was then obtained with retractors.  The patient was noted  to have a valgus neck on the contralateral hip with the head centered above  the tip of the trochanter.  This was accounted for with the neck osteotomy.  Following the neck osteotomy the femur was prepared for cementing technique  using a box osteotome followed by a starting  reamer.  I irrigated the canal  to remove any potential concern for fat emboli.  I then broached from size 1  to a size 5 where I had good purchase and initial stability.  There still  cancellous rim present.  Trial reduction was carried out with +1.5 inner  ball and 46 mm outer diameter ball.  The hip was reduced.  Leg lengths  appeared equal to the contralateral lower extremity.  The hip stability was  excellent, no signs of subluxation.  There was very little shuck in  extension indicating good soft tissue tension.   Given this, the final components were brought to field.  Once the final  components and all instruments were brought to the field, the trial  components were removed. Cement restrictor was placed deep and measured to  size 4.  I then irrigated the canal with pulse lavage with cleansing brush  and then packed it dry with sponges.  The  cement was mixed on the back  table.  Once it was ready it was injected into the canal in retrograde  fashion and pressurized.  The final size 5 cemented stem was then placed  with tension to keep it out of varus trying to push it against the lateral  wall of the femur.  It was held in a proximal 25 degrees of anteversion, the  position the broach was in.  Excessive cement was debrided.  Once the cement  had cured, excess cement debrided, final 46 +1.5 inner ball was impacted  onto a clean and dry trunnion and the hip reduced.  Note that I irrigated  out the canal reaming and looked for any loose fragments of cement prior to  this.  Once the hip was reduced, I assessed a range of motion.  It was  stable was noted in the trial reduction.   At this point, hemostasis was obtained as necessary.  I reapproximated the  posterior capsular leaflet to the superior leaflet with #1 Ethilon suture.  I then reapproximated the iliotibial band and gluteal fascia using #1  Vicryl.  The remaining wound was irrigated and closed in layers with a  running 4-0 Monocryl on the skin.  The hip was cleaned, dried and dressed  sterilely with Steri-Strips, dressing sponges, tape.  The patient was then  brought to the recovery room extubated in stable condition.      Madlyn Frankel Charlann Boxer, M.D.  Electronically Signed     MDO/MEDQ  D:  11/23/2005  T:  11/24/2005  Job:  [id number]

## 2010-12-25 ENCOUNTER — Other Ambulatory Visit: Payer: Self-pay | Admitting: Dermatology

## 2011-06-26 DIAGNOSIS — Z7901 Long term (current) use of anticoagulants: Secondary | ICD-10-CM | POA: Diagnosis not present

## 2011-06-26 DIAGNOSIS — I2699 Other pulmonary embolism without acute cor pulmonale: Secondary | ICD-10-CM | POA: Diagnosis not present

## 2011-07-22 DIAGNOSIS — Z85828 Personal history of other malignant neoplasm of skin: Secondary | ICD-10-CM | POA: Diagnosis not present

## 2011-08-11 DIAGNOSIS — I2699 Other pulmonary embolism without acute cor pulmonale: Secondary | ICD-10-CM | POA: Diagnosis not present

## 2011-08-11 DIAGNOSIS — Z7901 Long term (current) use of anticoagulants: Secondary | ICD-10-CM | POA: Diagnosis not present

## 2011-09-04 DIAGNOSIS — I4891 Unspecified atrial fibrillation: Secondary | ICD-10-CM | POA: Diagnosis not present

## 2011-09-04 DIAGNOSIS — Z7901 Long term (current) use of anticoagulants: Secondary | ICD-10-CM | POA: Diagnosis not present

## 2011-09-04 DIAGNOSIS — I10 Essential (primary) hypertension: Secondary | ICD-10-CM | POA: Diagnosis not present

## 2011-09-04 DIAGNOSIS — I2699 Other pulmonary embolism without acute cor pulmonale: Secondary | ICD-10-CM | POA: Diagnosis not present

## 2011-10-17 DIAGNOSIS — I2699 Other pulmonary embolism without acute cor pulmonale: Secondary | ICD-10-CM | POA: Diagnosis not present

## 2011-10-17 DIAGNOSIS — Z7901 Long term (current) use of anticoagulants: Secondary | ICD-10-CM | POA: Diagnosis not present

## 2011-10-23 DIAGNOSIS — E785 Hyperlipidemia, unspecified: Secondary | ICD-10-CM | POA: Diagnosis not present

## 2011-10-23 DIAGNOSIS — E039 Hypothyroidism, unspecified: Secondary | ICD-10-CM | POA: Diagnosis not present

## 2011-10-23 DIAGNOSIS — I10 Essential (primary) hypertension: Secondary | ICD-10-CM | POA: Diagnosis not present

## 2011-10-23 DIAGNOSIS — F411 Generalized anxiety disorder: Secondary | ICD-10-CM | POA: Diagnosis not present

## 2011-11-26 DIAGNOSIS — Z7901 Long term (current) use of anticoagulants: Secondary | ICD-10-CM | POA: Diagnosis not present

## 2011-11-26 DIAGNOSIS — I2699 Other pulmonary embolism without acute cor pulmonale: Secondary | ICD-10-CM | POA: Diagnosis not present

## 2012-01-14 DIAGNOSIS — Z7901 Long term (current) use of anticoagulants: Secondary | ICD-10-CM | POA: Diagnosis not present

## 2012-01-14 DIAGNOSIS — I2699 Other pulmonary embolism without acute cor pulmonale: Secondary | ICD-10-CM | POA: Diagnosis not present

## 2012-02-26 DIAGNOSIS — I4891 Unspecified atrial fibrillation: Secondary | ICD-10-CM | POA: Diagnosis not present

## 2012-02-26 DIAGNOSIS — Z7901 Long term (current) use of anticoagulants: Secondary | ICD-10-CM | POA: Diagnosis not present

## 2012-03-16 DIAGNOSIS — Z85828 Personal history of other malignant neoplasm of skin: Secondary | ICD-10-CM | POA: Diagnosis not present

## 2012-03-16 DIAGNOSIS — L570 Actinic keratosis: Secondary | ICD-10-CM | POA: Diagnosis not present

## 2012-03-31 DIAGNOSIS — Z23 Encounter for immunization: Secondary | ICD-10-CM | POA: Diagnosis not present

## 2012-04-12 DIAGNOSIS — Z7901 Long term (current) use of anticoagulants: Secondary | ICD-10-CM | POA: Diagnosis not present

## 2012-04-12 DIAGNOSIS — I4891 Unspecified atrial fibrillation: Secondary | ICD-10-CM | POA: Diagnosis not present

## 2012-05-24 DIAGNOSIS — Z7901 Long term (current) use of anticoagulants: Secondary | ICD-10-CM | POA: Diagnosis not present

## 2012-05-24 DIAGNOSIS — I2699 Other pulmonary embolism without acute cor pulmonale: Secondary | ICD-10-CM | POA: Diagnosis not present

## 2012-05-26 DIAGNOSIS — Z0000 Encounter for general adult medical examination without abnormal findings: Secondary | ICD-10-CM | POA: Diagnosis not present

## 2012-05-26 DIAGNOSIS — I10 Essential (primary) hypertension: Secondary | ICD-10-CM | POA: Diagnosis not present

## 2012-05-26 DIAGNOSIS — Z1331 Encounter for screening for depression: Secondary | ICD-10-CM | POA: Diagnosis not present

## 2012-05-26 DIAGNOSIS — M810 Age-related osteoporosis without current pathological fracture: Secondary | ICD-10-CM | POA: Diagnosis not present

## 2012-06-17 DIAGNOSIS — M5137 Other intervertebral disc degeneration, lumbosacral region: Secondary | ICD-10-CM | POA: Diagnosis not present

## 2012-06-25 DIAGNOSIS — M5137 Other intervertebral disc degeneration, lumbosacral region: Secondary | ICD-10-CM | POA: Diagnosis not present

## 2012-07-02 DIAGNOSIS — M5137 Other intervertebral disc degeneration, lumbosacral region: Secondary | ICD-10-CM | POA: Diagnosis not present

## 2012-07-09 DIAGNOSIS — S22009A Unspecified fracture of unspecified thoracic vertebra, initial encounter for closed fracture: Secondary | ICD-10-CM | POA: Diagnosis not present

## 2012-07-19 DIAGNOSIS — I2699 Other pulmonary embolism without acute cor pulmonale: Secondary | ICD-10-CM | POA: Diagnosis not present

## 2012-07-19 DIAGNOSIS — Z7901 Long term (current) use of anticoagulants: Secondary | ICD-10-CM | POA: Diagnosis not present

## 2012-07-23 DIAGNOSIS — M949 Disorder of cartilage, unspecified: Secondary | ICD-10-CM | POA: Diagnosis not present

## 2012-07-23 DIAGNOSIS — M899 Disorder of bone, unspecified: Secondary | ICD-10-CM | POA: Diagnosis not present

## 2012-07-23 DIAGNOSIS — I4891 Unspecified atrial fibrillation: Secondary | ICD-10-CM | POA: Diagnosis not present

## 2012-07-23 DIAGNOSIS — E785 Hyperlipidemia, unspecified: Secondary | ICD-10-CM | POA: Diagnosis not present

## 2012-07-23 DIAGNOSIS — E039 Hypothyroidism, unspecified: Secondary | ICD-10-CM | POA: Diagnosis not present

## 2012-07-23 DIAGNOSIS — I10 Essential (primary) hypertension: Secondary | ICD-10-CM | POA: Diagnosis not present

## 2012-07-28 DIAGNOSIS — I2699 Other pulmonary embolism without acute cor pulmonale: Secondary | ICD-10-CM | POA: Diagnosis not present

## 2012-07-28 DIAGNOSIS — Z7901 Long term (current) use of anticoagulants: Secondary | ICD-10-CM | POA: Diagnosis not present

## 2012-08-06 DIAGNOSIS — J019 Acute sinusitis, unspecified: Secondary | ICD-10-CM | POA: Diagnosis not present

## 2012-08-06 DIAGNOSIS — J309 Allergic rhinitis, unspecified: Secondary | ICD-10-CM | POA: Diagnosis not present

## 2012-08-11 DIAGNOSIS — Z7901 Long term (current) use of anticoagulants: Secondary | ICD-10-CM | POA: Diagnosis not present

## 2012-08-30 ENCOUNTER — Ambulatory Visit: Payer: Self-pay | Admitting: Cardiovascular Disease

## 2012-08-30 DIAGNOSIS — Z7901 Long term (current) use of anticoagulants: Secondary | ICD-10-CM | POA: Insufficient documentation

## 2012-08-30 DIAGNOSIS — I4891 Unspecified atrial fibrillation: Secondary | ICD-10-CM

## 2012-09-02 DIAGNOSIS — Z7901 Long term (current) use of anticoagulants: Secondary | ICD-10-CM | POA: Diagnosis not present

## 2012-09-02 DIAGNOSIS — I2699 Other pulmonary embolism without acute cor pulmonale: Secondary | ICD-10-CM | POA: Diagnosis not present

## 2012-09-13 DIAGNOSIS — I2699 Other pulmonary embolism without acute cor pulmonale: Secondary | ICD-10-CM | POA: Diagnosis not present

## 2012-09-13 DIAGNOSIS — Z7901 Long term (current) use of anticoagulants: Secondary | ICD-10-CM | POA: Diagnosis not present

## 2012-09-20 ENCOUNTER — Encounter: Payer: Self-pay | Admitting: *Deleted

## 2012-09-21 ENCOUNTER — Encounter: Payer: Self-pay | Admitting: Cardiovascular Disease

## 2012-10-04 DIAGNOSIS — I4891 Unspecified atrial fibrillation: Secondary | ICD-10-CM | POA: Diagnosis not present

## 2012-10-04 DIAGNOSIS — Z7901 Long term (current) use of anticoagulants: Secondary | ICD-10-CM | POA: Diagnosis not present

## 2012-10-19 DIAGNOSIS — Z7901 Long term (current) use of anticoagulants: Secondary | ICD-10-CM | POA: Diagnosis not present

## 2012-10-19 DIAGNOSIS — I4891 Unspecified atrial fibrillation: Secondary | ICD-10-CM | POA: Diagnosis not present

## 2012-11-16 ENCOUNTER — Encounter (HOSPITAL_COMMUNITY): Payer: Self-pay | Admitting: Cardiovascular Disease

## 2012-11-16 ENCOUNTER — Ambulatory Visit (INDEPENDENT_AMBULATORY_CARE_PROVIDER_SITE_OTHER): Payer: Medicare Other | Admitting: Cardiovascular Disease

## 2012-11-16 ENCOUNTER — Ambulatory Visit (INDEPENDENT_AMBULATORY_CARE_PROVIDER_SITE_OTHER): Payer: Medicare Other | Admitting: Pharmacist Clinician (PhC)/ Clinical Pharmacy Specialist

## 2012-11-16 VITALS — BP 124/70 | HR 75 | Resp 16 | Ht 70.0 in | Wt 144.7 lb

## 2012-11-16 DIAGNOSIS — I739 Peripheral vascular disease, unspecified: Secondary | ICD-10-CM

## 2012-11-16 DIAGNOSIS — I10 Essential (primary) hypertension: Secondary | ICD-10-CM

## 2012-11-16 DIAGNOSIS — M79609 Pain in unspecified limb: Secondary | ICD-10-CM

## 2012-11-16 DIAGNOSIS — I4891 Unspecified atrial fibrillation: Secondary | ICD-10-CM

## 2012-11-16 DIAGNOSIS — I480 Paroxysmal atrial fibrillation: Secondary | ICD-10-CM

## 2012-11-16 DIAGNOSIS — Z7901 Long term (current) use of anticoagulants: Secondary | ICD-10-CM

## 2012-11-16 DIAGNOSIS — M79604 Pain in right leg: Secondary | ICD-10-CM

## 2012-11-16 LAB — POCT INR: INR: 2.3

## 2012-11-16 NOTE — Patient Instructions (Addendum)
Your physician has requested that you have a lower extremity arterial duplex. This test is an ultrasound of the arteries in the legs. It looks at arterial blood flow in the legs. Allow one hour for Lower Arterial scans. There are no restrictions or special instructions. Your physician recommends that you schedule a follow-up appointment in: 1 year.

## 2012-12-07 ENCOUNTER — Encounter: Payer: Self-pay | Admitting: Cardiovascular Disease

## 2012-12-07 DIAGNOSIS — I10 Essential (primary) hypertension: Secondary | ICD-10-CM | POA: Insufficient documentation

## 2012-12-07 DIAGNOSIS — I739 Peripheral vascular disease, unspecified: Secondary | ICD-10-CM | POA: Insufficient documentation

## 2012-12-07 NOTE — Progress Notes (Signed)
Patient ID: ASPEN LAWRANCE, female   DOB: Jun 15, 1923, 79 y.o.   MRN: [id number]      Reason for office visit Atrial fibrillation  Mrs. Hayhurst presents as always accompanied by her husband. She has not had any new major health events. She has not had any stroke/TIA symptoms and has not had any bleeding problems while on warfarin anticoagulation. In the past years she has had a few falls including an injury to her tailbone in 2012. Poor balance and walks with a cane. Over the last 12 months she has not had any more falls. She has chronic mild dyspnea on exertion which hasn't changed. She complains of some pain in her legs when she walks but I'm not sure that she is describing intermittent claudication. It may be referred pain from her back. Her husband complains that she always has cold feet and is worried about arterial blockages in her legs  Previous evaluation (albeit more than 10 years ago) with echo and nuclear myocardial perfusion imaging has not shown any evidence of structural heart disease.    Allergies  Allergen Reactions  . Biaxin (Clarithromycin)   . Ceclor (Cefaclor)   . Phenobarbital   . Prednisone     Current Outpatient Prescriptions  Medication Sig Dispense Refill  . atenolol (TENORMIN) 50 MG tablet Take 50 mg by mouth daily.      . benazepril-hydrochlorthiazide (LOTENSIN HCT) 20-12.5 MG per tablet Take 1 tablet by mouth daily.      . calcium carbonate (OS-CAL) 600 MG TABS Take 600 mg by mouth daily.      . citalopram (CELEXA) 10 MG tablet Take 10 mg by mouth daily.      Marland Kitchen levothyroxine (SYNTHROID, LEVOTHROID) 75 MCG tablet Take 75 mcg by mouth daily before breakfast.      . nortriptyline (PAMELOR) 25 MG capsule Take 25 mg by mouth at bedtime.      Marland Kitchen warfarin (COUMADIN) 4 MG tablet Take 4 mg by mouth. Take one tablet every day except 2mg  on Monday/Wednesday/Friday       No current facility-administered medications for this visit.    Past Medical History    Diagnosis Date  . PAF (paroxysmal atrial fibrillation)     occasional palpitations, on coumadin  . HTN (hypertension)   . Osteoporosis   . H/O echocardiogram 01/28/2008    EF>55%, mild concentric LVH, right ventricular systolic pressure 30-15mmHg  . H/O cardiovascular stress test 04/28/2000    normal images EF 80%    No past surgical history on file.  Family History  Problem Relation Age of Onset  . Hypertension Father     History   Social History  . Marital Status: Married    Spouse Name: N/A    Number of Children: 2  . Years of Education: N/A   Occupational History  . Not on file.   Social History Main Topics  . Smoking status: Never Smoker   . Smokeless tobacco: Not on file  . Alcohol Use: No  . Drug Use: Not on file  . Sexually Active: Not on file   Other Topics Concern  . Not on file   Social History Narrative  . No narrative on file    Review of systems: The patient specifically denies any chest pain at rest or with exertion, dyspnea at rest (but she does have mild dyspnea with exertion), orthopnea, paroxysmal nocturnal dyspnea, syncope  She has rare palpitations, no focal neurological deficits, possible intermittent claudication.  No lower  extremity edema, unexplained weight gain, cough, hemoptysis or wheezing.  The patient also denies abdominal pain, nausea, vomiting, dysphagia, diarrhea, she has chronic constipation  No polyuria, polydipsia, dysuria, hematuria, frequency, urgency, abnormal bleeding or bruising, fever, chills, unexpected weight changes, mood swings, change in skin or hair texture, change in voice quality, auditory or visual problems, allergic reactions or rashes, new musculoskeletal complaints other than usual "aches and pains".   PHYSICAL EXAM BP 124/70  Pulse 75  Resp 16  Ht 5\' 10"  (1.778 m)  Wt 65.635 kg (144 lb 11.2 oz)  BMI 20.76 kg/m2  General: Alert, oriented x3, no distress Head: no evidence of trauma, PERRL, EOMI, no  exophtalmos or lid lag, no myxedema, no xanthelasma; normal ears, nose and oropharynx Neck: normal jugular venous pulsations and no hepatojugular reflux; brisk carotid pulses without delay and no carotid bruits Chest: clear to auscultation, no signs of consolidation by percussion or palpation, normal fremitus, symmetrical and full respiratory excursions Cardiovascular: normal position and quality of the apical impulse, regular rhythm, normal first and second heart sounds, no murmurs, rubs or gallops Abdomen: no tenderness or distention, no masses by palpation, no abnormal pulsatility or arterial bruits, normal bowel sounds, no hepatosplenomegaly Extremities: no clubbing, cyanosis or edema; 2+ radial, ulnar and brachial pulses bilaterally; 2+ right femoral, 1+ posterior tibial and dorsalis pedis pulses; 2+ left femoral, 1+ posterior tibial and dorsalis pedis pulses; no subclavian or femoral bruits Neurological: grossly nonfocal   EKG: Normal sinus rhythm, early RS transition in lead V2 unchanged from previous tracings  ASSESSMENT AND PLAN Atrial fibrillation Mrs. Gagen has paroxysmal atrial fibrillation and is on chronic treatment with warfarin to reduce the risk of embolic stroke. Her INR has been for the most part of the therapeutic range although she did have a level of 5.5 in March of this year. She has not had any bleeding problems. This year she has not had any falls but this is a constant concern because she is now elderly rather frail and has an unsteady gait. Her husband takes excellent care of her. Today she is in normal sinus rhythm. The threshold for discontinuation of anticoagulants is low because of the risk of injury.  PAD (peripheral artery disease), possible diagnosis We'll check lower extremity arterial Dopplers symptoms of possible intermittent claudication.  HTN (hypertension) Well-controlled   Orders Placed This Encounter  Procedures  . EKG 12-Lead  . Lower Extremity  Arterial Doppler Bilateral   No orders of the defined types were placed in this encounter.    Junious Silk, MD, [facility] [phone] office [phone] pager

## 2012-12-07 NOTE — Assessment & Plan Note (Signed)
Well controlled 

## 2012-12-07 NOTE — Assessment & Plan Note (Signed)
Mrs. Priego has paroxysmal atrial fibrillation and is on chronic treatment with warfarin to reduce the risk of embolic stroke. Her INR has been for the most part of the therapeutic range although she did have a level of 5.5 in March of this year. She has not had any bleeding problems. This year she has not had any falls but this is a constant concern because she is now elderly rather frail and has an unsteady gait. Her husband takes excellent care of her. Today she is in normal sinus rhythm. The threshold for discontinuation of anticoagulants is low because of the risk of injury.

## 2012-12-07 NOTE — Assessment & Plan Note (Signed)
We'll check lower extremity arterial Dopplers symptoms of possible intermittent claudication.

## 2012-12-30 ENCOUNTER — Ambulatory Visit (INDEPENDENT_AMBULATORY_CARE_PROVIDER_SITE_OTHER): Payer: Medicare Other | Admitting: Pharmacist Clinician (PhC)/ Clinical Pharmacy Specialist

## 2012-12-30 VITALS — BP 124/70 | HR 72

## 2012-12-30 DIAGNOSIS — I4891 Unspecified atrial fibrillation: Secondary | ICD-10-CM | POA: Diagnosis not present

## 2012-12-30 DIAGNOSIS — Z7901 Long term (current) use of anticoagulants: Secondary | ICD-10-CM

## 2012-12-30 LAB — POCT INR: INR: 2

## 2013-01-05 ENCOUNTER — Telehealth: Payer: Self-pay | Admitting: Cardiovascular Disease

## 2013-01-05 NOTE — Telephone Encounter (Signed)
**  CorrectionBelenda Cruise, PharmD spoke w/ dentist office.

## 2013-01-05 NOTE — Telephone Encounter (Signed)
Per JC, LPN, Belenda Cruise PharmD spoke w/ pt.

## 2013-01-17 ENCOUNTER — Other Ambulatory Visit: Payer: Self-pay | Admitting: Cardiovascular Disease

## 2013-01-17 ENCOUNTER — Ambulatory Visit (HOSPITAL_COMMUNITY)
Admission: RE | Admit: 2013-01-17 | Discharge: 2013-01-17 | Disposition: A | Discharge status: Home / Self Care | Payer: Medicare Other | Source: Ambulatory Visit | Attending: Cardiology | Admitting: Cardiology

## 2013-01-17 DIAGNOSIS — R0989 Other specified symptoms and signs involving the circulatory and respiratory systems: Secondary | ICD-10-CM

## 2013-01-17 DIAGNOSIS — M79604 Pain in right leg: Secondary | ICD-10-CM

## 2013-01-17 DIAGNOSIS — M79609 Pain in unspecified limb: Secondary | ICD-10-CM | POA: Diagnosis not present

## 2013-01-17 NOTE — Progress Notes (Signed)
Arterial Duplex Lower Ext. Completed. Allen Egerton, BS, RDMS, RVT  

## 2013-02-10 ENCOUNTER — Ambulatory Visit (INDEPENDENT_AMBULATORY_CARE_PROVIDER_SITE_OTHER): Payer: Medicare Other | Admitting: Pharmacist Clinician (PhC)/ Clinical Pharmacy Specialist

## 2013-02-10 VITALS — BP 142/64 | HR 68

## 2013-02-10 DIAGNOSIS — Z7901 Long term (current) use of anticoagulants: Secondary | ICD-10-CM | POA: Diagnosis not present

## 2013-02-10 DIAGNOSIS — I4891 Unspecified atrial fibrillation: Secondary | ICD-10-CM

## 2013-03-01 DIAGNOSIS — I10 Essential (primary) hypertension: Secondary | ICD-10-CM | POA: Diagnosis not present

## 2013-03-01 DIAGNOSIS — E039 Hypothyroidism, unspecified: Secondary | ICD-10-CM | POA: Diagnosis not present

## 2013-03-01 DIAGNOSIS — M810 Age-related osteoporosis without current pathological fracture: Secondary | ICD-10-CM | POA: Diagnosis not present

## 2013-03-08 DIAGNOSIS — Z23 Encounter for immunization: Secondary | ICD-10-CM | POA: Diagnosis not present

## 2013-03-08 DIAGNOSIS — I10 Essential (primary) hypertension: Secondary | ICD-10-CM | POA: Diagnosis not present

## 2013-03-08 DIAGNOSIS — E785 Hyperlipidemia, unspecified: Secondary | ICD-10-CM | POA: Diagnosis not present

## 2013-03-08 DIAGNOSIS — M810 Age-related osteoporosis without current pathological fracture: Secondary | ICD-10-CM | POA: Diagnosis not present

## 2013-03-08 DIAGNOSIS — E039 Hypothyroidism, unspecified: Secondary | ICD-10-CM | POA: Diagnosis not present

## 2013-03-10 ENCOUNTER — Ambulatory Visit (INDEPENDENT_AMBULATORY_CARE_PROVIDER_SITE_OTHER): Payer: Medicare Other | Admitting: Pharmacist Clinician (PhC)/ Clinical Pharmacy Specialist

## 2013-03-10 VITALS — BP 104/60 | HR 68

## 2013-03-10 DIAGNOSIS — Z7901 Long term (current) use of anticoagulants: Secondary | ICD-10-CM | POA: Diagnosis not present

## 2013-03-10 DIAGNOSIS — I4891 Unspecified atrial fibrillation: Secondary | ICD-10-CM

## 2013-03-10 LAB — POCT INR: INR: 1.9

## 2013-04-05 ENCOUNTER — Telehealth: Payer: Self-pay | Admitting: *Deleted

## 2013-04-05 MED ORDER — WARFARIN SODIUM 4 MG PO TABS: ORAL_TABLET | ORAL | Status: DC

## 2013-04-05 NOTE — Telephone Encounter (Signed)
Pt is stating that she needs her Warfarin refilled. She called the pharmacy and they told her to call us to refill it.

## 2013-04-05 NOTE — Telephone Encounter (Signed)
Message forwarded to K. Alvstad, PharmD.  

## 2013-04-07 ENCOUNTER — Other Ambulatory Visit: Payer: Self-pay | Admitting: Pharmacist Clinician (PhC)/ Clinical Pharmacy Specialist

## 2013-04-07 ENCOUNTER — Ambulatory Visit (INDEPENDENT_AMBULATORY_CARE_PROVIDER_SITE_OTHER): Payer: Medicare Other | Admitting: Pharmacist Clinician (PhC)/ Clinical Pharmacy Specialist

## 2013-04-07 VITALS — BP 142/72 | HR 72

## 2013-04-07 DIAGNOSIS — I4891 Unspecified atrial fibrillation: Secondary | ICD-10-CM

## 2013-04-07 DIAGNOSIS — Z7901 Long term (current) use of anticoagulants: Secondary | ICD-10-CM

## 2013-04-07 LAB — POCT INR: INR: 1.4

## 2013-04-28 ENCOUNTER — Ambulatory Visit (INDEPENDENT_AMBULATORY_CARE_PROVIDER_SITE_OTHER): Payer: Medicare Other | Admitting: Pharmacist Clinician (PhC)/ Clinical Pharmacy Specialist

## 2013-04-28 VITALS — BP 158/70 | HR 72

## 2013-04-28 DIAGNOSIS — I4891 Unspecified atrial fibrillation: Secondary | ICD-10-CM

## 2013-04-28 DIAGNOSIS — Z7901 Long term (current) use of anticoagulants: Secondary | ICD-10-CM

## 2013-04-28 LAB — POCT INR: INR: 2.9

## 2013-05-25 DIAGNOSIS — L821 Other seborrheic keratosis: Secondary | ICD-10-CM | POA: Diagnosis not present

## 2013-05-25 DIAGNOSIS — L738 Other specified follicular disorders: Secondary | ICD-10-CM | POA: Diagnosis not present

## 2013-05-25 DIAGNOSIS — D235 Other benign neoplasm of skin of trunk: Secondary | ICD-10-CM | POA: Diagnosis not present

## 2013-06-14 ENCOUNTER — Ambulatory Visit (INDEPENDENT_AMBULATORY_CARE_PROVIDER_SITE_OTHER): Payer: Medicare Other | Admitting: Pharmacist Clinician (PhC)/ Clinical Pharmacy Specialist

## 2013-06-14 VITALS — BP 122/60 | HR 72

## 2013-06-14 DIAGNOSIS — I4891 Unspecified atrial fibrillation: Secondary | ICD-10-CM | POA: Diagnosis not present

## 2013-06-14 DIAGNOSIS — Z7901 Long term (current) use of anticoagulants: Secondary | ICD-10-CM | POA: Diagnosis not present

## 2013-07-13 ENCOUNTER — Ambulatory Visit (INDEPENDENT_AMBULATORY_CARE_PROVIDER_SITE_OTHER): Payer: Medicare Other | Admitting: Pharmacist Clinician (PhC)/ Clinical Pharmacy Specialist

## 2013-07-13 VITALS — BP 160/70 | HR 80

## 2013-07-13 DIAGNOSIS — I4891 Unspecified atrial fibrillation: Secondary | ICD-10-CM

## 2013-07-13 DIAGNOSIS — Z7901 Long term (current) use of anticoagulants: Secondary | ICD-10-CM

## 2013-07-13 LAB — POCT INR: INR: 2

## 2013-07-13 MED ORDER — WARFARIN SODIUM 4 MG PO TABS: ORAL_TABLET | ORAL | Status: DC

## 2013-07-13 MED ORDER — ATENOLOL 50 MG PO TABS: 50.0000 mg | ORAL_TABLET | Freq: Every day | ORAL | Status: DC

## 2013-07-13 MED ORDER — BENAZEPRIL-HYDROCHLOROTHIAZIDE 20-12.5 MG PO TABS: 1.0000 | ORAL_TABLET | Freq: Every day | ORAL | Status: DC

## 2013-08-24 ENCOUNTER — Ambulatory Visit (INDEPENDENT_AMBULATORY_CARE_PROVIDER_SITE_OTHER): Payer: Medicare Other | Admitting: Pharmacist Clinician (PhC)/ Clinical Pharmacy Specialist

## 2013-08-24 DIAGNOSIS — Z7901 Long term (current) use of anticoagulants: Secondary | ICD-10-CM

## 2013-08-24 DIAGNOSIS — I4891 Unspecified atrial fibrillation: Secondary | ICD-10-CM | POA: Diagnosis not present

## 2013-08-24 LAB — POCT INR: INR: 2.1

## 2013-08-29 DIAGNOSIS — Z0000 Encounter for general adult medical examination without abnormal findings: Secondary | ICD-10-CM | POA: Diagnosis not present

## 2013-08-29 DIAGNOSIS — Z1331 Encounter for screening for depression: Secondary | ICD-10-CM | POA: Diagnosis not present

## 2013-08-29 DIAGNOSIS — E785 Hyperlipidemia, unspecified: Secondary | ICD-10-CM | POA: Diagnosis not present

## 2013-08-29 DIAGNOSIS — M810 Age-related osteoporosis without current pathological fracture: Secondary | ICD-10-CM | POA: Diagnosis not present

## 2013-08-29 DIAGNOSIS — Z23 Encounter for immunization: Secondary | ICD-10-CM | POA: Diagnosis not present

## 2013-08-29 DIAGNOSIS — I10 Essential (primary) hypertension: Secondary | ICD-10-CM | POA: Diagnosis not present

## 2013-09-05 DIAGNOSIS — E039 Hypothyroidism, unspecified: Secondary | ICD-10-CM | POA: Diagnosis not present

## 2013-09-05 DIAGNOSIS — E785 Hyperlipidemia, unspecified: Secondary | ICD-10-CM | POA: Diagnosis not present

## 2013-09-05 DIAGNOSIS — I4891 Unspecified atrial fibrillation: Secondary | ICD-10-CM | POA: Diagnosis not present

## 2013-09-05 DIAGNOSIS — I10 Essential (primary) hypertension: Secondary | ICD-10-CM | POA: Diagnosis not present

## 2013-10-07 ENCOUNTER — Ambulatory Visit (INDEPENDENT_AMBULATORY_CARE_PROVIDER_SITE_OTHER): Payer: Medicare Other | Admitting: Pharmacist Clinician (PhC)/ Clinical Pharmacy Specialist

## 2013-10-07 VITALS — BP 142/74

## 2013-10-07 DIAGNOSIS — Z7901 Long term (current) use of anticoagulants: Secondary | ICD-10-CM | POA: Diagnosis not present

## 2013-10-07 DIAGNOSIS — I4891 Unspecified atrial fibrillation: Secondary | ICD-10-CM | POA: Diagnosis not present

## 2013-10-07 LAB — POCT INR: INR: 2.1

## 2013-11-18 ENCOUNTER — Ambulatory Visit (INDEPENDENT_AMBULATORY_CARE_PROVIDER_SITE_OTHER): Payer: Medicare Other | Admitting: Pharmacist Clinician (PhC)/ Clinical Pharmacy Specialist

## 2013-11-18 DIAGNOSIS — I4891 Unspecified atrial fibrillation: Secondary | ICD-10-CM

## 2013-11-18 DIAGNOSIS — Z7901 Long term (current) use of anticoagulants: Secondary | ICD-10-CM

## 2013-11-18 LAB — POCT INR: INR: 3

## 2013-12-20 ENCOUNTER — Ambulatory Visit (INDEPENDENT_AMBULATORY_CARE_PROVIDER_SITE_OTHER): Payer: Medicare Other | Admitting: Cardiovascular Disease

## 2013-12-20 ENCOUNTER — Encounter: Payer: Self-pay | Admitting: Cardiovascular Disease

## 2013-12-20 VITALS — BP 140/70 | HR 67 | Resp 16 | Ht 70.0 in | Wt 141.7 lb

## 2013-12-20 DIAGNOSIS — I480 Paroxysmal atrial fibrillation: Secondary | ICD-10-CM

## 2013-12-20 DIAGNOSIS — I4891 Unspecified atrial fibrillation: Secondary | ICD-10-CM | POA: Diagnosis not present

## 2013-12-20 MED ORDER — ATENOLOL 50 MG PO TABS: 25.0000 mg | ORAL_TABLET | Freq: Every day | ORAL | Status: DC

## 2013-12-20 NOTE — Patient Instructions (Signed)
DECREASE Atenolol to 1/2 tablet a day (25mg ).  Dr. Sallyanne Kuster recommends that you schedule a follow-up appointment in: One year

## 2013-12-22 ENCOUNTER — Encounter: Payer: Self-pay | Admitting: Cardiovascular Disease

## 2013-12-22 NOTE — Progress Notes (Signed)
Patient ID: [name], female   DOB: [birth year], 80 y.o.   MRN: [id number]      Reason for office visit PAF  [name] has a history of recurrent paroxysmal atrial fibrillation without a history of stroke/TIA or other embolic events and without congestive heart failure, valvular heart disease or coronary problems.  No specific complaints except that she is getting old. She is tired. She is accompanied by her brother and appears to have worsening problems with confusion intermittently. She has infrequent and relatively brief episodes of palpitations. She has not had any falls in over 2 years. She has not had any bleeding complications. She does not have known structural heart disease, but her workup was performed over 10 years ago.   Allergies  Allergen Reactions  . Biaxin [Clarithromycin]   . Ceclor [Cefaclor]   . Phenobarbital   . Prednisone     Current Outpatient Prescriptions  Medication Sig Dispense Refill  . atenolol (TENORMIN) 50 MG tablet Take 0.5 tablets (25 mg total) by mouth daily.  90 tablet  1  . benazepril-hydrochlorthiazide (LOTENSIN HCT) 20-12.5 MG per tablet Take 1 tablet by mouth daily.  90 tablet  1  . calcium carbonate (OS-CAL) 600 MG TABS Take 600 mg by mouth daily.      . Cholecalciferol (VITAMIN D-3) 1000 UNITS CAPS Take 1,000 Units by mouth daily.      . citalopram (CELEXA) 10 MG tablet Take 10 mg by mouth daily.      Marland Kitchen levothyroxine (SYNTHROID, LEVOTHROID) 75 MCG tablet Take 75 mcg by mouth daily before breakfast.      . warfarin (COUMADIN) 4 MG tablet Take one tablet every day or as directed  90 tablet  1   No current facility-administered medications for this visit.    Past Medical History  Diagnosis Date  . PAF (paroxysmal atrial fibrillation)     occasional palpitations, on coumadin  . HTN (hypertension)   . Osteoporosis   . H/O echocardiogram 01/28/2008    EF>55%, mild concentric LVH, right ventricular systolic pressure [id number]  .  H/O cardiovascular stress test 04/28/2000    normal images EF 80%    No past surgical history on file.  Family History  Problem Relation Age of Onset  . Hypertension Father     History   Social History  . Marital Status: Married    Spouse Name: N/A    Number of Children: 2  . Years of Education: N/A   Occupational History  . Not on file.   Social History Main Topics  . Smoking status: Never Smoker   . Smokeless tobacco: Not on file  . Alcohol Use: No  . Drug Use: Not on file  . Sexual Activity: Not on file   Other Topics Concern  . Not on file   Social History Narrative  . No narrative on file    Review of systems: The patient specifically denies any chest pain at rest or with exertion, dyspnea at rest or with exertion, orthopnea, paroxysmal nocturnal dyspnea, syncope, focal neurological deficits, intermittent claudication, lower extremity edema, unexplained weight gain, cough, hemoptysis or wheezing.  The patient also denies abdominal pain, nausea, vomiting, dysphagia, diarrhea, constipation, polyuria, polydipsia, dysuria, hematuria, frequency, urgency, abnormal bleeding or bruising, fever, chills, unexpected weight changes, mood swings, change in skin or hair texture, change in voice quality, auditory or visual problems, allergic reactions or rashes, new musculoskeletal complaints other than usual "aches and pains".   PHYSICAL EXAM BP 140/70  Pulse 67  Resp 16  Ht 5\' 10"  (1.778 m)  Wt 141 lb 11.2 oz (64.275 kg)  BMI 20.33 kg/m2  General: Alert, oriented x3, no distress Head: no evidence of trauma, PERRL, EOMI, no exophtalmos or lid lag, no myxedema, no xanthelasma; normal ears, nose and oropharynx Neck: normal jugular venous pulsations and no hepatojugular reflux; brisk carotid pulses without delay and no carotid bruits Chest: clear to auscultation, no signs of consolidation by percussion or palpation, normal fremitus, symmetrical and full respiratory  excursions Cardiovascular: normal position and quality of the apical impulse, regular rhythm, normal first and second heart sounds, no murmurs, rubs or gallops Abdomen: no tenderness or distention, no masses by palpation, no abnormal pulsatility or arterial bruits, normal bowel sounds, no hepatosplenomegaly Extremities: no clubbing, cyanosis or edema; 2+ radial, ulnar and brachial pulses bilaterally; 2+ right femoral, posterior tibial and dorsalis pedis pulses; 2+ left femoral, posterior tibial and dorsalis pedis pulses; no subclavian or femoral bruits Neurological: grossly nonfocal   EKG:NSR  ASSESSMENT AND PLAN  [name] is minimally symptomatic from her arrhythmia. She is tolerating full dose anticoagulation without bleeding complication there has been a long time since she has had a fall. On the other hand she is quite elderly and the risk/benefit ratio of anticoagulation is to be constantly reassessed. For the time being I think her medical regimen should continue unchanged.  Orders Placed This Encounter  Procedures  . EKG 12-Lead   Meds ordered this encounter  Medications  . atenolol (TENORMIN) 50 MG tablet    Sig: Take 0.5 tablets (25 mg total) by mouth daily.    Dispense:  90 tablet    Refill:  [place] Kamber Vignola, MD, [facility] [phone] office [phone] pager

## 2013-12-28 ENCOUNTER — Ambulatory Visit (INDEPENDENT_AMBULATORY_CARE_PROVIDER_SITE_OTHER): Payer: Medicare Other | Admitting: Pharmacist

## 2013-12-28 DIAGNOSIS — Z7901 Long term (current) use of anticoagulants: Secondary | ICD-10-CM | POA: Diagnosis not present

## 2013-12-28 DIAGNOSIS — I4891 Unspecified atrial fibrillation: Secondary | ICD-10-CM

## 2013-12-28 LAB — POCT INR: INR: 3.4

## 2014-01-25 ENCOUNTER — Ambulatory Visit (INDEPENDENT_AMBULATORY_CARE_PROVIDER_SITE_OTHER): Payer: Medicare Other | Admitting: Pharmacist Clinician (PhC)/ Clinical Pharmacy Specialist

## 2014-01-25 DIAGNOSIS — I4891 Unspecified atrial fibrillation: Secondary | ICD-10-CM | POA: Diagnosis not present

## 2014-01-25 DIAGNOSIS — Z7901 Long term (current) use of anticoagulants: Secondary | ICD-10-CM

## 2014-01-25 LAB — POCT INR: INR: 2.6

## 2014-03-08 ENCOUNTER — Ambulatory Visit (INDEPENDENT_AMBULATORY_CARE_PROVIDER_SITE_OTHER): Payer: Medicare Other | Admitting: Pharmacist Clinician (PhC)/ Clinical Pharmacy Specialist

## 2014-03-08 DIAGNOSIS — Z7901 Long term (current) use of anticoagulants: Secondary | ICD-10-CM

## 2014-03-08 DIAGNOSIS — I4891 Unspecified atrial fibrillation: Secondary | ICD-10-CM

## 2014-03-08 DIAGNOSIS — E785 Hyperlipidemia, unspecified: Secondary | ICD-10-CM | POA: Diagnosis not present

## 2014-03-08 DIAGNOSIS — I10 Essential (primary) hypertension: Secondary | ICD-10-CM | POA: Diagnosis not present

## 2014-03-08 DIAGNOSIS — E039 Hypothyroidism, unspecified: Secondary | ICD-10-CM | POA: Diagnosis not present

## 2014-03-08 DIAGNOSIS — M810 Age-related osteoporosis without current pathological fracture: Secondary | ICD-10-CM | POA: Diagnosis not present

## 2014-03-08 LAB — POCT INR: INR: 2.2

## 2014-03-15 DIAGNOSIS — E785 Hyperlipidemia, unspecified: Secondary | ICD-10-CM | POA: Diagnosis not present

## 2014-03-15 DIAGNOSIS — Z23 Encounter for immunization: Secondary | ICD-10-CM | POA: Diagnosis not present

## 2014-03-15 DIAGNOSIS — I4891 Unspecified atrial fibrillation: Secondary | ICD-10-CM | POA: Diagnosis not present

## 2014-03-15 DIAGNOSIS — I10 Essential (primary) hypertension: Secondary | ICD-10-CM | POA: Diagnosis not present

## 2014-03-15 DIAGNOSIS — E039 Hypothyroidism, unspecified: Secondary | ICD-10-CM | POA: Diagnosis not present

## 2014-03-27 DIAGNOSIS — M810 Age-related osteoporosis without current pathological fracture: Secondary | ICD-10-CM | POA: Diagnosis not present

## 2014-04-18 ENCOUNTER — Other Ambulatory Visit: Payer: Self-pay | Admitting: Cardiovascular Disease

## 2014-04-19 ENCOUNTER — Ambulatory Visit (INDEPENDENT_AMBULATORY_CARE_PROVIDER_SITE_OTHER): Payer: Medicare Other | Admitting: Pharmacist Clinician (PhC)/ Clinical Pharmacy Specialist

## 2014-04-19 DIAGNOSIS — I4891 Unspecified atrial fibrillation: Secondary | ICD-10-CM | POA: Diagnosis not present

## 2014-04-19 DIAGNOSIS — Z7901 Long term (current) use of anticoagulants: Secondary | ICD-10-CM

## 2014-04-19 LAB — POCT INR: INR: 2.2

## 2014-04-19 NOTE — Telephone Encounter (Signed)
Rx was sent to pharmacy electronically. 

## 2014-05-18 ENCOUNTER — Other Ambulatory Visit: Payer: Self-pay | Admitting: Cardiovascular Disease

## 2014-05-31 ENCOUNTER — Ambulatory Visit (INDEPENDENT_AMBULATORY_CARE_PROVIDER_SITE_OTHER): Payer: Medicare Other | Admitting: Pharmacist Clinician (PhC)/ Clinical Pharmacy Specialist

## 2014-05-31 DIAGNOSIS — I4891 Unspecified atrial fibrillation: Secondary | ICD-10-CM | POA: Diagnosis not present

## 2014-05-31 DIAGNOSIS — Z7901 Long term (current) use of anticoagulants: Secondary | ICD-10-CM

## 2014-05-31 LAB — POCT INR: INR: 2.4

## 2014-07-12 ENCOUNTER — Ambulatory Visit (INDEPENDENT_AMBULATORY_CARE_PROVIDER_SITE_OTHER): Payer: Medicare Other | Admitting: Pharmacist Clinician (PhC)/ Clinical Pharmacy Specialist

## 2014-07-12 DIAGNOSIS — Z7901 Long term (current) use of anticoagulants: Secondary | ICD-10-CM | POA: Diagnosis not present

## 2014-07-12 DIAGNOSIS — I4891 Unspecified atrial fibrillation: Secondary | ICD-10-CM | POA: Diagnosis not present

## 2014-07-12 LAB — POCT INR: INR: 3.2

## 2014-08-09 ENCOUNTER — Ambulatory Visit (INDEPENDENT_AMBULATORY_CARE_PROVIDER_SITE_OTHER): Payer: Medicare Other | Admitting: Pharmacist Clinician (PhC)/ Clinical Pharmacy Specialist

## 2014-08-09 DIAGNOSIS — Z7901 Long term (current) use of anticoagulants: Secondary | ICD-10-CM | POA: Diagnosis not present

## 2014-08-09 DIAGNOSIS — I4891 Unspecified atrial fibrillation: Secondary | ICD-10-CM

## 2014-08-09 LAB — POCT INR: INR: 3.5

## 2014-08-30 ENCOUNTER — Ambulatory Visit (INDEPENDENT_AMBULATORY_CARE_PROVIDER_SITE_OTHER): Payer: Medicare Other | Admitting: Pharmacist Clinician (PhC)/ Clinical Pharmacy Specialist

## 2014-08-30 DIAGNOSIS — I4891 Unspecified atrial fibrillation: Secondary | ICD-10-CM

## 2014-08-30 DIAGNOSIS — Z7901 Long term (current) use of anticoagulants: Secondary | ICD-10-CM | POA: Diagnosis not present

## 2014-08-30 LAB — POCT INR: INR: 1.8

## 2014-09-22 DIAGNOSIS — I10 Essential (primary) hypertension: Secondary | ICD-10-CM | POA: Diagnosis not present

## 2014-09-22 DIAGNOSIS — Z23 Encounter for immunization: Secondary | ICD-10-CM | POA: Diagnosis not present

## 2014-09-22 DIAGNOSIS — Z0000 Encounter for general adult medical examination without abnormal findings: Secondary | ICD-10-CM | POA: Diagnosis not present

## 2014-09-22 DIAGNOSIS — E039 Hypothyroidism, unspecified: Secondary | ICD-10-CM | POA: Diagnosis not present

## 2014-09-22 DIAGNOSIS — M810 Age-related osteoporosis without current pathological fracture: Secondary | ICD-10-CM | POA: Diagnosis not present

## 2014-09-22 DIAGNOSIS — Z1389 Encounter for screening for other disorder: Secondary | ICD-10-CM | POA: Diagnosis not present

## 2014-09-26 DIAGNOSIS — M810 Age-related osteoporosis without current pathological fracture: Secondary | ICD-10-CM | POA: Diagnosis not present

## 2014-09-27 ENCOUNTER — Ambulatory Visit (INDEPENDENT_AMBULATORY_CARE_PROVIDER_SITE_OTHER): Payer: Medicare Other | Admitting: Pharmacist Clinician (PhC)/ Clinical Pharmacy Specialist

## 2014-09-27 DIAGNOSIS — I4891 Unspecified atrial fibrillation: Secondary | ICD-10-CM | POA: Diagnosis not present

## 2014-09-27 LAB — POCT INR: INR: 1.8

## 2014-10-03 DIAGNOSIS — M810 Age-related osteoporosis without current pathological fracture: Secondary | ICD-10-CM | POA: Diagnosis not present

## 2014-10-03 DIAGNOSIS — I4891 Unspecified atrial fibrillation: Secondary | ICD-10-CM | POA: Diagnosis not present

## 2014-10-03 DIAGNOSIS — I10 Essential (primary) hypertension: Secondary | ICD-10-CM | POA: Diagnosis not present

## 2014-10-03 DIAGNOSIS — I5032 Chronic diastolic (congestive) heart failure: Secondary | ICD-10-CM | POA: Diagnosis not present

## 2014-10-25 ENCOUNTER — Ambulatory Visit (INDEPENDENT_AMBULATORY_CARE_PROVIDER_SITE_OTHER): Payer: Medicare Other | Admitting: Pharmacist Clinician (PhC)/ Clinical Pharmacy Specialist

## 2014-10-25 DIAGNOSIS — Z7901 Long term (current) use of anticoagulants: Secondary | ICD-10-CM

## 2014-10-25 DIAGNOSIS — I4891 Unspecified atrial fibrillation: Secondary | ICD-10-CM | POA: Diagnosis not present

## 2014-10-25 LAB — POCT INR: INR: 2.1

## 2014-11-10 ENCOUNTER — Telehealth: Payer: Self-pay | Admitting: Cardiovascular Disease

## 2014-11-10 NOTE — Telephone Encounter (Signed)
Closed encounter °

## 2014-11-15 ENCOUNTER — Other Ambulatory Visit: Payer: Self-pay | Admitting: *Deleted

## 2014-11-15 MED ORDER — BENAZEPRIL-HYDROCHLOROTHIAZIDE 20-12.5 MG PO TABS: 1.0000 | ORAL_TABLET | Freq: Every day | ORAL | Status: AC

## 2014-12-05 ENCOUNTER — Ambulatory Visit (INDEPENDENT_AMBULATORY_CARE_PROVIDER_SITE_OTHER): Payer: Medicare Other | Admitting: Pharmacist

## 2014-12-05 DIAGNOSIS — I4891 Unspecified atrial fibrillation: Secondary | ICD-10-CM | POA: Diagnosis not present

## 2014-12-05 DIAGNOSIS — Z7901 Long term (current) use of anticoagulants: Secondary | ICD-10-CM

## 2014-12-05 LAB — POCT INR: INR: 2.6

## 2015-01-14 ENCOUNTER — Other Ambulatory Visit: Payer: Self-pay | Admitting: Cardiovascular Disease

## 2015-01-17 ENCOUNTER — Ambulatory Visit (INDEPENDENT_AMBULATORY_CARE_PROVIDER_SITE_OTHER): Payer: Medicare Other | Admitting: Pharmacist Clinician (PhC)/ Clinical Pharmacy Specialist

## 2015-01-17 DIAGNOSIS — I4891 Unspecified atrial fibrillation: Secondary | ICD-10-CM

## 2015-01-17 DIAGNOSIS — Z7901 Long term (current) use of anticoagulants: Secondary | ICD-10-CM

## 2015-01-17 LAB — POCT INR: INR: 1.1

## 2015-02-15 ENCOUNTER — Ambulatory Visit (INDEPENDENT_AMBULATORY_CARE_PROVIDER_SITE_OTHER): Payer: Medicare Other | Admitting: Cardiovascular Disease

## 2015-02-15 ENCOUNTER — Encounter: Payer: Self-pay | Admitting: Cardiovascular Disease

## 2015-02-15 ENCOUNTER — Ambulatory Visit (INDEPENDENT_AMBULATORY_CARE_PROVIDER_SITE_OTHER): Payer: Medicare Other | Admitting: Pharmacist Clinician (PhC)/ Clinical Pharmacy Specialist

## 2015-02-15 VITALS — BP 140/62 | HR 64 | Resp 16 | Ht 68.0 in | Wt 139.0 lb

## 2015-02-15 DIAGNOSIS — I739 Peripheral vascular disease, unspecified: Secondary | ICD-10-CM

## 2015-02-15 DIAGNOSIS — I480 Paroxysmal atrial fibrillation: Secondary | ICD-10-CM

## 2015-02-15 DIAGNOSIS — Z7901 Long term (current) use of anticoagulants: Secondary | ICD-10-CM

## 2015-02-15 DIAGNOSIS — Z79899 Other long term (current) drug therapy: Secondary | ICD-10-CM | POA: Diagnosis not present

## 2015-02-15 DIAGNOSIS — I4891 Unspecified atrial fibrillation: Secondary | ICD-10-CM | POA: Diagnosis not present

## 2015-02-15 DIAGNOSIS — I10 Essential (primary) hypertension: Secondary | ICD-10-CM

## 2015-02-15 LAB — POCT INR: INR: 4.6

## 2015-02-15 MED ORDER — APIXABAN 5 MG PO TABS: 5.0000 mg | ORAL_TABLET | Freq: Two times a day (BID) | ORAL | Status: DC

## 2015-02-15 NOTE — Progress Notes (Signed)
Patient ID: [name], female   DOB: 06-02-1923, 81 y.o.   MRN: [id number]     Cardiology Office Note   Date:  02/15/2015   ID:  [name], DOB 1923-03-12, MRN [id number]  PCP:  Thressa Sheller, MD  Cardiologist:   Sanda Klein, MD   Chief Complaint  Patient presents with  . Annual Exam    Patient has no complaints.      History of Present Illness: [name] is a 81 y.o. female who presents for  Paroxysmal atrial fibrillation. She does not have known structural heart disease and has never had a stroke or transient ischemic attack. She has just celebrated her 89th birthday and complains of being old. She has slowly progressive memory problems and rare episodes of confusion. She has not had any falls in the last 3 years and has not had any bleeding complications with warfarin. It is becoming harder to transport her to the Coumadin clinic. She denies any cardiovascular complaints.    Past Medical History  Diagnosis Date  . PAF (paroxysmal atrial fibrillation)     occasional palpitations, on coumadin  . HTN (hypertension)   . Osteoporosis   . H/O echocardiogram 01/28/2008    EF>55%, mild concentric LVH, right ventricular systolic pressure [id number]  . H/O cardiovascular stress test 04/28/2000    normal images EF 80%    History reviewed. No pertinent past surgical history.   Current Outpatient Prescriptions  Medication Sig Dispense Refill  . atenolol (TENORMIN) 50 MG tablet Take 0.5 tablets (25 mg total) by mouth daily. 90 tablet 1  . benazepril-hydrochlorthiazide (LOTENSIN HCT) 20-12.5 MG per tablet Take 1 tablet by mouth daily. 90 tablet 0  . calcium carbonate (OS-CAL) 600 MG TABS Take 600 mg by mouth daily.    . Cholecalciferol (VITAMIN D-3) 1000 UNITS CAPS Take 1,000 Units by mouth daily.    . citalopram (CELEXA) 10 MG tablet Take 10 mg by mouth daily.    Marland Kitchen levothyroxine (SYNTHROID, LEVOTHROID) 75 MCG tablet Take 75 mcg by mouth daily before  breakfast.    . apixaban (ELIQUIS) 5 MG TABS tablet Take 1 tablet (5 mg total) by mouth 2 (two) times daily. 60 tablet 5   No current facility-administered medications for this visit.    Allergies:   Biaxin; Ceclor; Phenobarbital; and Prednisone    Social History:  The patient  reports that she has never smoked. She does not have any smokeless tobacco history on file. She reports that she does not drink alcohol.   Family History:  The patient's family history includes Hypertension in her father.    ROS:  Please see the history of present illness.    Otherwise, review of systems positive for none.   All other systems are reviewed and negative.    PHYSICAL EXAM: VS:  BP 140/62 mmHg  Pulse 64  Resp 16  Ht 5\' 8"  (1.727 m)  Wt 139 lb (63.05 kg)  BMI 21.14 kg/m2 , BMI Body mass index is 21.14 kg/(m^2).  General: Alert, oriented x3, no distress Head: no evidence of trauma, PERRL, EOMI, no exophtalmos or lid lag, no myxedema, no xanthelasma; normal ears, nose and oropharynx Neck: normal jugular venous pulsations and no hepatojugular reflux; brisk carotid pulses without delay and no carotid bruits Chest: clear to auscultation, no signs of consolidation by percussion or palpation, normal fremitus, symmetrical and full respiratory excursions Cardiovascular: normal position and quality of the apical impulse, regular rhythm, normal first and second heart sounds, no  murmurs, rubs or gallops Abdomen: no tenderness or distention, no masses by palpation, no abnormal pulsatility or arterial bruits, normal bowel sounds, no hepatosplenomegaly Extremities: no clubbing, cyanosis or edema; 2+ radial, ulnar and brachial pulses bilaterally; 2+ right femoral, posterior tibial and dorsalis pedis pulses; 2+ left femoral, posterior tibial and dorsalis pedis pulses; no subclavian or femoral bruits Neurological: grossly nonfocal Psych: euthymic mood, full affect   EKG:  EKG is ordered today. The ekg ordered  today demonstrates NSR, normal   Recent Labs: No results found for requested labs within last 365 days.    Lipid Panel No results found for: CHOL, TRIG, HDL, CHOLHDL, VLDL, LDLCALC, LDLDIRECT    Wt Readings from Last 3 Encounters:  02/15/15 139 lb (63.05 kg)  12/20/13 141 lb 11.2 oz (64.275 kg)  11/16/12 144 lb 11.2 oz (65.635 kg)   .   ASSESSMENT AND PLAN:   [name] has rare and asymptomatic episodes of paroxysmal atrial fibrillation. Her embolic risk is elevated due to age/gender /hypertension (CHADSVasc score 4) , visits to the Coumadin clinic have become difficult. Discussed switching to a direct oral anticoagulant and after we check updated renal function labs plan to start eliquis 5 mg twice daily.  Her blood pressure is well controlled.  Obviously her bleeding risk is also increased due to her age, but that this does not appear to be prohibitive and appears to be less than the risk of embolic stroke.    Current medicines are reviewed at length with the patient today.  The patient does not have concerns regarding medicines.  The following changes have been made:   Stop warfarin and start eloquence 5 mg twice a day , pending on results of lab tests  Labs/ tests ordered today include:  Orders Placed This Encounter  Procedures  . Basic metabolic panel  . EKG 12-Lead   Patient Instructions  Medication Instructions:   STOP WARFARIN  START ELIQUIS 5MG  TWICE DAILY  Labwork:  Today at [place]  Any Other Special Instructions Will Be Listed Below (If Applicable).       Mikael Spray, MD  02/15/2015 5:15 PM    Sanda Klein, MD, [facility] [phone] office [phone] pager

## 2015-02-15 NOTE — Patient Instructions (Signed)
Medication Instructions:   STOP WARFARIN  START ELIQUIS 5MG  TWICE DAILY  Labwork:  Today at [place]  Any Other Special Instructions Will Be Listed Below (If Applicable).

## 2015-02-16 LAB — BASIC METABOLIC PANEL
BUN: 25 mg/dL (ref 7–25)
CALCIUM: 8.9 mg/dL (ref 8.6–10.4)
CO2: 28 mmol/L (ref 20–31)
Chloride: 98 mmol/L (ref 98–110)
Creat: 0.85 mg/dL (ref 0.60–0.88)
GLUCOSE: 81 mg/dL (ref 65–99)
Potassium: 3.9 mmol/L (ref 3.5–5.3)
Sodium: 136 mmol/L (ref 135–146)

## 2015-02-20 ENCOUNTER — Telehealth: Payer: Self-pay | Admitting: *Deleted

## 2015-02-20 ENCOUNTER — Other Ambulatory Visit: Payer: Self-pay | Admitting: *Deleted

## 2015-02-20 MED ORDER — APIXABAN 5 MG PO TABS: 5.0000 mg | ORAL_TABLET | Freq: Two times a day (BID) | ORAL | Status: DC

## 2015-02-20 NOTE — Telephone Encounter (Signed)
PA for Eliquis approved through 02/20/2016 under her Medicare Part D benefit.  [place] #[id number]

## 2015-02-20 NOTE — Telephone Encounter (Signed)
PA for Eliquis faxed to [facility] through CoverMyMeds done.

## 2015-03-28 DIAGNOSIS — I10 Essential (primary) hypertension: Secondary | ICD-10-CM | POA: Diagnosis not present

## 2015-03-28 DIAGNOSIS — E039 Hypothyroidism, unspecified: Secondary | ICD-10-CM | POA: Diagnosis not present

## 2015-03-28 DIAGNOSIS — M810 Age-related osteoporosis without current pathological fracture: Secondary | ICD-10-CM | POA: Diagnosis not present

## 2015-04-04 DIAGNOSIS — M810 Age-related osteoporosis without current pathological fracture: Secondary | ICD-10-CM | POA: Diagnosis not present

## 2015-04-04 DIAGNOSIS — I4891 Unspecified atrial fibrillation: Secondary | ICD-10-CM | POA: Diagnosis not present

## 2015-04-04 DIAGNOSIS — F339 Major depressive disorder, recurrent, unspecified: Secondary | ICD-10-CM | POA: Diagnosis not present

## 2015-04-04 DIAGNOSIS — I5032 Chronic diastolic (congestive) heart failure: Secondary | ICD-10-CM | POA: Diagnosis not present

## 2015-04-04 DIAGNOSIS — N184 Chronic kidney disease, stage 4 (severe): Secondary | ICD-10-CM | POA: Diagnosis not present

## 2015-04-04 DIAGNOSIS — Z23 Encounter for immunization: Secondary | ICD-10-CM | POA: Diagnosis not present

## 2015-05-07 ENCOUNTER — Other Ambulatory Visit: Payer: Self-pay | Admitting: Cardiovascular Disease

## 2016-01-15 DIAGNOSIS — H02006 Unspecified entropion of left eye, unspecified eyelid: Secondary | ICD-10-CM | POA: Diagnosis not present

## 2016-01-15 DIAGNOSIS — H353130 Nonexudative age-related macular degeneration, bilateral, stage unspecified: Secondary | ICD-10-CM | POA: Diagnosis not present

## 2016-01-15 DIAGNOSIS — H43813 Vitreous degeneration, bilateral: Secondary | ICD-10-CM | POA: Diagnosis not present

## 2016-01-15 DIAGNOSIS — H02003 Unspecified entropion of right eye, unspecified eyelid: Secondary | ICD-10-CM | POA: Diagnosis not present

## 2016-01-15 DIAGNOSIS — H33301 Unspecified retinal break, right eye: Secondary | ICD-10-CM | POA: Diagnosis not present

## 2016-01-15 DIAGNOSIS — Z961 Presence of intraocular lens: Secondary | ICD-10-CM | POA: Diagnosis not present

## 2016-02-04 ENCOUNTER — Other Ambulatory Visit: Payer: Self-pay | Admitting: Cardiovascular Disease

## 2016-02-12 DIAGNOSIS — E559 Vitamin D deficiency, unspecified: Secondary | ICD-10-CM | POA: Diagnosis not present

## 2016-02-12 DIAGNOSIS — E039 Hypothyroidism, unspecified: Secondary | ICD-10-CM | POA: Diagnosis not present

## 2016-02-12 DIAGNOSIS — Z0000 Encounter for general adult medical examination without abnormal findings: Secondary | ICD-10-CM | POA: Diagnosis not present

## 2016-02-12 DIAGNOSIS — N184 Chronic kidney disease, stage 4 (severe): Secondary | ICD-10-CM | POA: Diagnosis not present

## 2016-02-12 DIAGNOSIS — Z1389 Encounter for screening for other disorder: Secondary | ICD-10-CM | POA: Diagnosis not present

## 2016-02-12 DIAGNOSIS — I5032 Chronic diastolic (congestive) heart failure: Secondary | ICD-10-CM | POA: Diagnosis not present

## 2016-02-12 DIAGNOSIS — Z23 Encounter for immunization: Secondary | ICD-10-CM | POA: Diagnosis not present

## 2016-02-12 DIAGNOSIS — M810 Age-related osteoporosis without current pathological fracture: Secondary | ICD-10-CM | POA: Diagnosis not present

## 2016-02-15 ENCOUNTER — Ambulatory Visit (INDEPENDENT_AMBULATORY_CARE_PROVIDER_SITE_OTHER): Payer: Medicare Other | Admitting: Cardiovascular Disease

## 2016-02-15 ENCOUNTER — Other Ambulatory Visit: Payer: Self-pay

## 2016-02-15 ENCOUNTER — Encounter: Payer: Self-pay | Admitting: Cardiovascular Disease

## 2016-02-15 VITALS — BP 140/82 | HR 82 | Ht 68.0 in | Wt 140.6 lb

## 2016-02-15 DIAGNOSIS — Z7901 Long term (current) use of anticoagulants: Secondary | ICD-10-CM | POA: Diagnosis not present

## 2016-02-15 DIAGNOSIS — I10 Essential (primary) hypertension: Secondary | ICD-10-CM | POA: Diagnosis not present

## 2016-02-15 DIAGNOSIS — I480 Paroxysmal atrial fibrillation: Secondary | ICD-10-CM

## 2016-02-15 MED ORDER — METOPROLOL SUCCINATE ER 25 MG PO TB24: 25.0000 mg | ORAL_TABLET | Freq: Every day | ORAL | 11 refills | Status: DC

## 2016-02-15 NOTE — Patient Instructions (Signed)
Dr Sallyanne Kuster has recommended making the following medication changes: 1. STOP Atenolol 2. START Metoprolol Succinate 25 mg - take 1 tablet by mouth daily  Dr C recommends that you schedule a follow-up appointment in 1 year. You will receive a reminder letter in the mail two months in advance. If you don't receive a letter, please call our office to schedule the follow-up appointment.  If you need a refill on your cardiac medications before your next appointment, please call your pharmacy.

## 2016-02-15 NOTE — Progress Notes (Signed)
Cardiology Office Note    Date:  02/15/2016   ID:  [name], DOB 02/28/1923, MRN [id number]  PCP:  Thressa Sheller, MD  Cardiologist:   Sanda Klein, MD   Chief Complaint  Patient presents with  . Follow-up    History of Present Illness:  [name] is a 82 y.o. female with a history of paroxysmal atrial fibrillation and hypertension complicated by mild left ventricular hypertrophy, here for follow-up. She has slowly progressive dementia. She complains of feeling cold and weak, but her family members state that she is actually doing very well. She has occasional palpitations that make her feel weaker than baseline. She transitioned from warfarin to Eliquis without any bleeding problems, it has become very difficult for her to come to the monthly prothrombin time checks.  She denies angina pectoris, edema, syncope, palpitations, focal neurological complaints. She has been out of atenolol for a day due to the national shortage.  Past Medical History:  Diagnosis Date  . H/O cardiovascular stress test 04/28/2000   normal images EF 80%  . H/O echocardiogram 01/28/2008   EF>55%, mild concentric LVH, right ventricular systolic pressure [id number]  . HTN (hypertension)   . Osteoporosis   . PAF (paroxysmal atrial fibrillation) (HCC)    occasional palpitations, on coumadin    No past surgical history on file.  Current Medications: Outpatient Medications Prior to Visit  Medication Sig Dispense Refill  . apixaban (ELIQUIS) 5 MG TABS tablet Take 1 tablet (5 mg total) by mouth 2 (two) times daily. 60 tablet 11  . benazepril-hydrochlorthiazide (LOTENSIN HCT) 20-12.5 MG per tablet Take 1 tablet by mouth daily. 90 tablet 0  . calcium carbonate (OS-CAL) 600 MG TABS Take 600 mg by mouth daily.    . Cholecalciferol (VITAMIN D-3) 1000 UNITS CAPS Take 1,000 Units by mouth daily.    . citalopram (CELEXA) 10 MG tablet Take 10 mg by mouth daily.    Marland Kitchen levothyroxine (SYNTHROID,  LEVOTHROID) 75 MCG tablet Take 75 mcg by mouth daily before breakfast.    . atenolol (TENORMIN) 50 MG tablet TAKE ONE-HALF TABLET BY MOUTH ONCE DAILY 45 tablet 0   No facility-administered medications prior to visit.      Allergies:   Biaxin [clarithromycin]; Ceclor [cefaclor]; Phenobarbital; and Prednisone   Social History   Social History  . Marital status: Married    Spouse name: N/A  . Number of children: 2  . Years of education: N/A   Social History Main Topics  . Smoking status: Never Smoker  . Smokeless tobacco: None  . Alcohol use No  . Drug use: Unknown  . Sexual activity: Not Asked   Other Topics Concern  . None   Social History Narrative  . None     Family History:  The patient's family history includes Hypertension in her father.   ROS:   Please see the history of present illness.    ROS All other systems reviewed and are negative.   PHYSICAL EXAM:   VS:  BP 140/82 (BP Location: Right Arm, Patient Position: Sitting, Cuff Size: Normal)   Pulse 82   Ht 5\' 8"  (1.727 m)   Wt 140 lb 9.6 oz (63.8 kg)   SpO2 96%   BMI 21.38 kg/m    GEN: Well nourished, well developed, in no acute distress  HEENT: normal  Neck: no JVD, carotid bruits, or masses Cardiac: RRR; no murmurs, rubs, or gallops,no edema  Respiratory:  clear to auscultation bilaterally, normal work of  breathing GI: soft, nontender, nondistended, + BS MS: no deformity or atrophy  Skin: warm and dry, no rash Neuro:  Alert and Oriented x 3, Strength and sensation are intact Psych: euthymic mood, full affect  Wt Readings from Last 3 Encounters:  02/15/16 140 lb 9.6 oz (63.8 kg)  02/15/15 139 lb (63 kg)  12/20/13 141 lb 11.2 oz (64.3 kg)      Studies/Labs Reviewed:   EKG:  EKG is ordered today.  The ekg ordered today demonstrates Sinus rhythm, normal tracing, QTC 439 ms  Recent Labs: 02/15/2015: BUN 25; Creat 0.85; Potassium 3.9; Sodium 136     ASSESSMENT:    1. Paroxysmal atrial  fibrillation (HCC)   2. Essential hypertension   3. Long term (current) use of anticoagulants      PLAN:  In order of problems listed above:  1. Afib: It's hard to say how frequent the episodes really are due to her memory problems, but they seem to be only mildly symptomatic. Will replace her atenolol with metoprolol succinate until the national shortage abates. Continue anticoagulation. She has not had any falls, injuries or serious bleeding. 2. HTN: Good control;  3. will request routine labs from Dr. Doristine Devoid office.    Medication Adjustments/Labs and Tests Ordered: Current medicines are reviewed at length with the patient today.  Concerns regarding medicines are outlined above.  Medication changes, Labs and Tests ordered today are listed in the Patient Instructions below. Patient Instructions  Dr Sallyanne Kuster has recommended making the following medication changes: 1. STOP Atenolol 2. START Metoprolol Succinate 25 mg - take 1 tablet by mouth daily  Dr C recommends that you schedule a follow-up appointment in 1 year. You will receive a reminder letter in the mail two months in advance. If you don't receive a letter, please call our office to schedule the follow-up appointment.  If you need a refill on your cardiac medications before your next appointment, please call your pharmacy.    Signed, Sanda Klein, MD  02/15/2016 10:46 AM    [facility] [place], [place],   [address] Phone: [phone]; Fax: [phone]

## 2016-02-15 NOTE — Addendum Note (Signed)
Addended by: Diana Eves on: 02/15/2016 02:04 PM   Modules accepted: Orders

## 2016-02-19 DIAGNOSIS — I4891 Unspecified atrial fibrillation: Secondary | ICD-10-CM | POA: Diagnosis not present

## 2016-02-19 DIAGNOSIS — N184 Chronic kidney disease, stage 4 (severe): Secondary | ICD-10-CM | POA: Diagnosis not present

## 2016-02-19 DIAGNOSIS — F339 Major depressive disorder, recurrent, unspecified: Secondary | ICD-10-CM | POA: Diagnosis not present

## 2016-02-19 DIAGNOSIS — I129 Hypertensive chronic kidney disease with stage 1 through stage 4 chronic kidney disease, or unspecified chronic kidney disease: Secondary | ICD-10-CM | POA: Diagnosis not present

## 2016-02-25 DIAGNOSIS — M810 Age-related osteoporosis without current pathological fracture: Secondary | ICD-10-CM | POA: Diagnosis not present

## 2016-04-16 DIAGNOSIS — H26491 Other secondary cataract, right eye: Secondary | ICD-10-CM | POA: Diagnosis not present

## 2016-04-28 ENCOUNTER — Other Ambulatory Visit: Payer: Self-pay | Admitting: Cardiovascular Disease

## 2016-05-28 DIAGNOSIS — Z7901 Long term (current) use of anticoagulants: Secondary | ICD-10-CM | POA: Diagnosis not present

## 2016-05-28 DIAGNOSIS — Z961 Presence of intraocular lens: Secondary | ICD-10-CM | POA: Diagnosis not present

## 2016-05-28 DIAGNOSIS — I10 Essential (primary) hypertension: Secondary | ICD-10-CM | POA: Diagnosis not present

## 2016-05-28 DIAGNOSIS — Z9841 Cataract extraction status, right eye: Secondary | ICD-10-CM | POA: Diagnosis not present

## 2016-05-28 DIAGNOSIS — Z888 Allergy status to other drugs, medicaments and biological substances status: Secondary | ICD-10-CM | POA: Diagnosis not present

## 2016-05-28 DIAGNOSIS — H02035 Senile entropion of left lower eyelid: Secondary | ICD-10-CM | POA: Diagnosis not present

## 2016-05-28 DIAGNOSIS — Z881 Allergy status to other antibiotic agents status: Secondary | ICD-10-CM | POA: Diagnosis not present

## 2016-05-28 DIAGNOSIS — Z79899 Other long term (current) drug therapy: Secondary | ICD-10-CM | POA: Diagnosis not present

## 2016-06-12 ENCOUNTER — Encounter: Payer: Self-pay | Admitting: Cardiovascular Disease

## 2016-06-12 ENCOUNTER — Telehealth: Payer: Self-pay | Admitting: Cardiovascular Disease

## 2016-06-12 NOTE — Telephone Encounter (Signed)
Sent via epic 

## 2016-06-12 NOTE — Telephone Encounter (Signed)
Called son and made aware ok to hold eliquis 48 hours prior to procedure per pharmacist.  Clearance sent via Epic by Dr. Sallyanne Kuster.    Verbalized understanding.  Advised to call with further questions/concerns.

## 2016-06-12 NOTE — Telephone Encounter (Signed)
Pt with Afib, CHADS<4, and no history of stroke ok to hold Eliquis 48 hours prior to procedure.

## 2016-06-12 NOTE — Telephone Encounter (Signed)
Mrs. Stronach is scheduled for eye lid surgery 06/26/16 with Dr. Ramonita Lab at the [facility] in  [place] [phone]).  Patient needs clearance to stop Eliquis prior to surgery.  Pleas call Amalie Asay at [phone] to advise.

## 2016-06-12 NOTE — Telephone Encounter (Signed)
Noted clearance letter states to hold warfarin for 5 days prior to procedure-pt currently on eliquis.    Routed to pharmacist to clarify.

## 2016-06-26 DIAGNOSIS — H02035 Senile entropion of left lower eyelid: Secondary | ICD-10-CM | POA: Diagnosis not present

## 2016-07-22 DIAGNOSIS — H43813 Vitreous degeneration, bilateral: Secondary | ICD-10-CM | POA: Diagnosis not present

## 2016-07-22 DIAGNOSIS — Z961 Presence of intraocular lens: Secondary | ICD-10-CM | POA: Diagnosis not present

## 2016-07-22 DIAGNOSIS — H33301 Unspecified retinal break, right eye: Secondary | ICD-10-CM | POA: Diagnosis not present

## 2016-07-22 DIAGNOSIS — H02009 Unspecified entropion of unspecified eye, unspecified eyelid: Secondary | ICD-10-CM | POA: Diagnosis not present

## 2016-07-22 DIAGNOSIS — H353130 Nonexudative age-related macular degeneration, bilateral, stage unspecified: Secondary | ICD-10-CM | POA: Diagnosis not present

## 2016-07-24 DIAGNOSIS — H02035 Senile entropion of left lower eyelid: Secondary | ICD-10-CM | POA: Diagnosis not present

## 2016-07-24 DIAGNOSIS — L570 Actinic keratosis: Secondary | ICD-10-CM | POA: Diagnosis not present

## 2016-07-24 DIAGNOSIS — H02032 Senile entropion of right lower eyelid: Secondary | ICD-10-CM | POA: Diagnosis not present

## 2016-07-24 DIAGNOSIS — Z9181 History of falling: Secondary | ICD-10-CM | POA: Diagnosis not present

## 2016-07-24 DIAGNOSIS — D487 Neoplasm of uncertain behavior of other specified sites: Secondary | ICD-10-CM | POA: Diagnosis not present

## 2016-08-11 DIAGNOSIS — I10 Essential (primary) hypertension: Secondary | ICD-10-CM | POA: Diagnosis not present

## 2016-08-11 DIAGNOSIS — E039 Hypothyroidism, unspecified: Secondary | ICD-10-CM | POA: Diagnosis not present

## 2016-08-11 DIAGNOSIS — E785 Hyperlipidemia, unspecified: Secondary | ICD-10-CM | POA: Diagnosis not present

## 2016-08-11 DIAGNOSIS — I129 Hypertensive chronic kidney disease with stage 1 through stage 4 chronic kidney disease, or unspecified chronic kidney disease: Secondary | ICD-10-CM | POA: Diagnosis not present

## 2016-08-11 DIAGNOSIS — E559 Vitamin D deficiency, unspecified: Secondary | ICD-10-CM | POA: Diagnosis not present

## 2016-08-25 DIAGNOSIS — N184 Chronic kidney disease, stage 4 (severe): Secondary | ICD-10-CM | POA: Diagnosis not present

## 2016-08-25 DIAGNOSIS — F339 Major depressive disorder, recurrent, unspecified: Secondary | ICD-10-CM | POA: Diagnosis not present

## 2016-08-25 DIAGNOSIS — I5032 Chronic diastolic (congestive) heart failure: Secondary | ICD-10-CM | POA: Diagnosis not present

## 2016-08-25 DIAGNOSIS — I4891 Unspecified atrial fibrillation: Secondary | ICD-10-CM | POA: Diagnosis not present

## 2016-09-16 DIAGNOSIS — H02006 Unspecified entropion of left eye, unspecified eyelid: Secondary | ICD-10-CM | POA: Diagnosis not present

## 2016-09-16 DIAGNOSIS — Z961 Presence of intraocular lens: Secondary | ICD-10-CM | POA: Diagnosis not present

## 2016-09-16 DIAGNOSIS — H33301 Unspecified retinal break, right eye: Secondary | ICD-10-CM | POA: Diagnosis not present

## 2016-09-16 DIAGNOSIS — H43813 Vitreous degeneration, bilateral: Secondary | ICD-10-CM | POA: Diagnosis not present

## 2016-09-16 DIAGNOSIS — M810 Age-related osteoporosis without current pathological fracture: Secondary | ICD-10-CM | POA: Diagnosis not present

## 2016-09-16 DIAGNOSIS — H02003 Unspecified entropion of right eye, unspecified eyelid: Secondary | ICD-10-CM | POA: Diagnosis not present

## 2016-09-16 DIAGNOSIS — H353130 Nonexudative age-related macular degeneration, bilateral, stage unspecified: Secondary | ICD-10-CM | POA: Diagnosis not present

## 2017-02-12 ENCOUNTER — Encounter: Payer: Self-pay | Admitting: Cardiovascular Disease

## 2017-02-12 DIAGNOSIS — Z0000 Encounter for general adult medical examination without abnormal findings: Secondary | ICD-10-CM | POA: Diagnosis not present

## 2017-02-12 DIAGNOSIS — N390 Urinary tract infection, site not specified: Secondary | ICD-10-CM | POA: Diagnosis not present

## 2017-02-12 DIAGNOSIS — E7800 Pure hypercholesterolemia, unspecified: Secondary | ICD-10-CM | POA: Diagnosis not present

## 2017-02-12 DIAGNOSIS — E559 Vitamin D deficiency, unspecified: Secondary | ICD-10-CM | POA: Diagnosis not present

## 2017-02-12 DIAGNOSIS — I10 Essential (primary) hypertension: Secondary | ICD-10-CM | POA: Diagnosis not present

## 2017-02-17 ENCOUNTER — Other Ambulatory Visit: Payer: Self-pay | Admitting: Cardiovascular Disease

## 2017-02-23 ENCOUNTER — Other Ambulatory Visit: Payer: Self-pay

## 2017-02-23 DIAGNOSIS — E039 Hypothyroidism, unspecified: Secondary | ICD-10-CM | POA: Diagnosis not present

## 2017-02-23 DIAGNOSIS — M810 Age-related osteoporosis without current pathological fracture: Secondary | ICD-10-CM | POA: Diagnosis not present

## 2017-02-23 DIAGNOSIS — E559 Vitamin D deficiency, unspecified: Secondary | ICD-10-CM | POA: Diagnosis not present

## 2017-02-23 DIAGNOSIS — H6123 Impacted cerumen, bilateral: Secondary | ICD-10-CM | POA: Diagnosis not present

## 2017-02-23 DIAGNOSIS — I4891 Unspecified atrial fibrillation: Secondary | ICD-10-CM | POA: Diagnosis not present

## 2017-02-23 MED ORDER — METOPROLOL SUCCINATE ER 25 MG PO TB24: 25.0000 mg | ORAL_TABLET | Freq: Every day | ORAL | 3 refills | Status: DC

## 2017-02-25 ENCOUNTER — Telehealth: Payer: Self-pay | Admitting: Cardiovascular Disease

## 2017-02-25 MED ORDER — METOPROLOL SUCCINATE ER 25 MG PO TB24: 25.0000 mg | ORAL_TABLET | Freq: Every day | ORAL | 0 refills | Status: DC

## 2017-02-25 NOTE — Telephone Encounter (Signed)
Rx(s) sent to pharmacy electronically.  

## 2017-02-25 NOTE — Telephone Encounter (Signed)
New Message      *STAT* If patient is at the pharmacy, call can be transferred to refill team.   1. Which medications need to be refilled? (please list name of each medication and dose if known)  metoprolol succinate (TOPROL-XL) 25 MG 24 hr tablet Take 1 tablet (25 mg total) by mouth daily     2. Which pharmacy/location (including street and city if local pharmacy) is medication to be sent to? [facility] on [address]  3. Do they need a 30 day or 90 day supply?  30  Has appt 9/25

## 2017-03-10 ENCOUNTER — Ambulatory Visit (INDEPENDENT_AMBULATORY_CARE_PROVIDER_SITE_OTHER): Payer: Medicare Other | Admitting: Cardiology

## 2017-03-10 ENCOUNTER — Encounter: Payer: Self-pay | Admitting: Cardiology

## 2017-03-10 ENCOUNTER — Telehealth: Payer: Self-pay | Admitting: Cardiovascular Disease

## 2017-03-10 VITALS — BP 130/68 | HR 84 | Ht 64.0 in | Wt 145.0 lb

## 2017-03-10 DIAGNOSIS — I4891 Unspecified atrial fibrillation: Secondary | ICD-10-CM

## 2017-03-10 DIAGNOSIS — I10 Essential (primary) hypertension: Secondary | ICD-10-CM | POA: Diagnosis not present

## 2017-03-10 DIAGNOSIS — Z7901 Long term (current) use of anticoagulants: Secondary | ICD-10-CM | POA: Diagnosis not present

## 2017-03-10 DIAGNOSIS — I480 Paroxysmal atrial fibrillation: Secondary | ICD-10-CM

## 2017-03-10 MED ORDER — METOPROLOL SUCCINATE ER 25 MG PO TB24: 25.0000 mg | ORAL_TABLET | Freq: Every day | ORAL | 3 refills | Status: DC

## 2017-03-10 MED ORDER — APIXABAN 5 MG PO TABS: 5.0000 mg | ORAL_TABLET | Freq: Two times a day (BID) | ORAL | 3 refills | Status: DC

## 2017-03-10 MED ORDER — APIXABAN 2.5 MG PO TABS: 2.5000 mg | ORAL_TABLET | Freq: Two times a day (BID) | ORAL | 3 refills | Status: DC

## 2017-03-10 NOTE — Assessment & Plan Note (Signed)
In NSR today 

## 2017-03-10 NOTE — Telephone Encounter (Signed)
Left message for pt to call.

## 2017-03-10 NOTE — Addendum Note (Signed)
Addended by: Ricci Barker on: 03/10/2017 02:25 PM   Modules accepted: Orders

## 2017-03-10 NOTE — Telephone Encounter (Signed)
Spoke with pt son, after leaving the office today, it was decided the patient needs to be on eliquis 2.5 mg twice daily instead of 5 mg. The patients son understands to cut the 5 mg tablets in 1/2.

## 2017-03-10 NOTE — Patient Instructions (Addendum)
Medication Instructions: DECREASE the Eliquis to 2.5 mg tablet twice daily  If you need a refill on your cardiac medications before your next appointment, please call your pharmacy.    Follow-Up: Your physician wants you to follow-up in: 12 months with Dr. Sallyanne Kuster. You will receive a reminder letter in the mail two months in advance. If you don't receive a letter, please call our office at [phone] to schedule this follow-up appointment.    Thank you for choosing [facility] at [facility]!!

## 2017-03-10 NOTE — Progress Notes (Addendum)
03/10/2017 [name]   July 17, 1922  [id number]  Primary Physician Thressa Sheller, MD Primary Cardiologist: Dr Sallyanne Kuster  HPI:  83 y.o. female with a history of paroxysmal atrial fibrillation and hypertension complicated by mild left ventricular hypertrophy, here with her son for a one year follow-up and medication refills. She has slowly progressive dementia but is alert and pleasant. She is HOH and has lost her hearing aid. Her son tells me she has not had any significant medical issues or complaints since her LOV. She had labs draw at dr McKenzie's office last month that were normal-including BMP, CBC, TSH. Her EKG today shows her to be in NSR- 84.   Current Outpatient Prescriptions  Medication Sig Dispense Refill  . apixaban (ELIQUIS) 5 MG TABS tablet Take 1 tablet (5 mg total) by mouth 2 (two) times daily. 180 tablet 3  . benazepril-hydrochlorthiazide (LOTENSIN HCT) 20-12.5 MG per tablet Take 1 tablet by mouth daily. 90 tablet 0  . Cholecalciferol (VITAMIN D-3) 1000 UNITS CAPS Take 1,000 Units by mouth daily.    . citalopram (CELEXA) 20 MG tablet Take 0.5 tablets by mouth daily.    Marland Kitchen levothyroxine (SYNTHROID, LEVOTHROID) 75 MCG tablet Take 75 mcg by mouth daily before breakfast.    . metoprolol succinate (TOPROL-XL) 25 MG 24 hr tablet Take 1 tablet (25 mg total) by mouth daily. 90 tablet 3   No current facility-administered medications for this visit.     Allergies  Allergen Reactions  . Biaxin [Clarithromycin]   . Ceclor [Cefaclor]   . Phenobarbital   . Prednisone     Past Medical History:  Diagnosis Date  . H/O cardiovascular stress test 04/28/2000   normal images EF 80%  . H/O echocardiogram 01/28/2008   EF>55%, mild concentric LVH, right ventricular systolic pressure [id number]  . HTN (hypertension)   . Osteoporosis   . PAF (paroxysmal atrial fibrillation) (HCC)    occasional palpitations, on coumadin    Social History   Social History  . Marital  status: Married    Spouse name: N/A  . Number of children: 2  . Years of education: N/A   Occupational History  . Not on file.   Social History Main Topics  . Smoking status: Never Smoker  . Smokeless tobacco: Never Used  . Alcohol use No  . Drug use: Unknown  . Sexual activity: Not on file   Other Topics Concern  . Not on file   Social History Narrative  . No narrative on file     Family History  Problem Relation Age of Onset  . Hypertension Father      Review of Systems: General: negative for chills, fever, night sweats or weight changes.  Cardiovascular: negative for chest pain, dyspnea on exertion, edema, orthopnea, palpitations, paroxysmal nocturnal dyspnea or shortness of breath Dermatological: negative for rash Respiratory: negative for cough or wheezing Urologic: negative for hematuria Abdominal: negative for nausea, vomiting, diarrhea, bright red blood per rectum, melena, or hematemesis Neurologic: negative for visual changes, syncope, or dizziness All other systems reviewed and are otherwise negative except as noted above.    Blood pressure 130/68, pulse 84, height 5\' 4"  (1.626 m), weight 145 lb (65.8 kg).  General appearance: alert, cooperative and no distress Neck: no carotid bruit and no JVD Lungs: clear to auscultation bilaterally Heart: regular rate and rhythm Extremities: no edema Skin: Skin color, texture, turgor normal. No rashes or lesions Neurologic: Grossly normal, HOH  EKG NSR  ASSESSMENT AND PLAN:  PAF (paroxysmal atrial fibrillation) (HCC) In NSR today  Long term (current) use of anticoagulants Wgt is 65 kg, renal function is normal SCr 0.9- on Eliquis 5 mg BID  HTN (hypertension) Controlled on current medications   PLAN  Medications refilled. After discussion with Dr Sallyanne Kuster we have decided to cut her Eliquis back to 2.5 mg BID. F/U with Dr Sallyanne Kuster in a year.  Kerin Ransom PA-C 03/10/2017 2:17 PM

## 2017-03-10 NOTE — Assessment & Plan Note (Signed)
Controlled on current medications 

## 2017-03-10 NOTE — Telephone Encounter (Signed)
New Message  Joey verbalized that he is returning call for rn  For patient

## 2017-03-10 NOTE — Assessment & Plan Note (Addendum)
Wgt is 65 kg, renal function is normal SCr 0.9- on Eliquis 5 mg BID

## 2017-03-24 DIAGNOSIS — M810 Age-related osteoporosis without current pathological fracture: Secondary | ICD-10-CM | POA: Diagnosis not present

## 2017-03-24 DIAGNOSIS — H6123 Impacted cerumen, bilateral: Secondary | ICD-10-CM | POA: Diagnosis not present

## 2017-06-27 ENCOUNTER — Other Ambulatory Visit: Payer: Self-pay | Admitting: Cardiovascular Disease

## 2017-10-01 DIAGNOSIS — M810 Age-related osteoporosis without current pathological fracture: Secondary | ICD-10-CM | POA: Diagnosis not present

## 2017-10-08 DIAGNOSIS — E559 Vitamin D deficiency, unspecified: Secondary | ICD-10-CM | POA: Diagnosis not present

## 2017-10-08 DIAGNOSIS — I4891 Unspecified atrial fibrillation: Secondary | ICD-10-CM | POA: Diagnosis not present

## 2017-10-08 DIAGNOSIS — F419 Anxiety disorder, unspecified: Secondary | ICD-10-CM | POA: Diagnosis not present

## 2017-10-08 DIAGNOSIS — M810 Age-related osteoporosis without current pathological fracture: Secondary | ICD-10-CM | POA: Diagnosis not present

## 2017-10-08 DIAGNOSIS — E039 Hypothyroidism, unspecified: Secondary | ICD-10-CM | POA: Diagnosis not present

## 2018-02-18 DIAGNOSIS — Z0000 Encounter for general adult medical examination without abnormal findings: Secondary | ICD-10-CM | POA: Diagnosis not present

## 2018-02-18 DIAGNOSIS — I10 Essential (primary) hypertension: Secondary | ICD-10-CM | POA: Diagnosis not present

## 2018-02-18 DIAGNOSIS — I129 Hypertensive chronic kidney disease with stage 1 through stage 4 chronic kidney disease, or unspecified chronic kidney disease: Secondary | ICD-10-CM | POA: Diagnosis not present

## 2018-02-18 DIAGNOSIS — E785 Hyperlipidemia, unspecified: Secondary | ICD-10-CM | POA: Diagnosis not present

## 2018-02-18 DIAGNOSIS — E039 Hypothyroidism, unspecified: Secondary | ICD-10-CM | POA: Diagnosis not present

## 2018-02-18 DIAGNOSIS — E559 Vitamin D deficiency, unspecified: Secondary | ICD-10-CM | POA: Diagnosis not present

## 2018-02-25 DIAGNOSIS — R413 Other amnesia: Secondary | ICD-10-CM | POA: Diagnosis not present

## 2018-02-25 DIAGNOSIS — I10 Essential (primary) hypertension: Secondary | ICD-10-CM | POA: Diagnosis not present

## 2018-02-25 DIAGNOSIS — E039 Hypothyroidism, unspecified: Secondary | ICD-10-CM | POA: Diagnosis not present

## 2018-02-25 DIAGNOSIS — Z7901 Long term (current) use of anticoagulants: Secondary | ICD-10-CM | POA: Diagnosis not present

## 2018-02-25 DIAGNOSIS — M810 Age-related osteoporosis without current pathological fracture: Secondary | ICD-10-CM | POA: Diagnosis not present

## 2018-02-25 DIAGNOSIS — Z23 Encounter for immunization: Secondary | ICD-10-CM | POA: Diagnosis not present

## 2018-02-25 DIAGNOSIS — F339 Major depressive disorder, recurrent, unspecified: Secondary | ICD-10-CM | POA: Diagnosis not present

## 2018-02-25 DIAGNOSIS — I4891 Unspecified atrial fibrillation: Secondary | ICD-10-CM | POA: Diagnosis not present

## 2018-02-25 DIAGNOSIS — E559 Vitamin D deficiency, unspecified: Secondary | ICD-10-CM | POA: Diagnosis not present

## 2018-02-25 DIAGNOSIS — Z0000 Encounter for general adult medical examination without abnormal findings: Secondary | ICD-10-CM | POA: Diagnosis not present

## 2018-03-03 DIAGNOSIS — M810 Age-related osteoporosis without current pathological fracture: Secondary | ICD-10-CM | POA: Diagnosis not present

## 2018-03-21 ENCOUNTER — Other Ambulatory Visit: Payer: Self-pay | Admitting: Cardiology

## 2018-04-02 DIAGNOSIS — M810 Age-related osteoporosis without current pathological fracture: Secondary | ICD-10-CM | POA: Diagnosis not present

## 2018-04-27 DIAGNOSIS — G4700 Insomnia, unspecified: Secondary | ICD-10-CM | POA: Diagnosis not present

## 2018-04-27 DIAGNOSIS — R4189 Other symptoms and signs involving cognitive functions and awareness: Secondary | ICD-10-CM | POA: Diagnosis not present

## 2018-04-27 DIAGNOSIS — F329 Major depressive disorder, single episode, unspecified: Secondary | ICD-10-CM | POA: Diagnosis not present

## 2018-06-20 ENCOUNTER — Other Ambulatory Visit: Payer: Self-pay | Admitting: Cardiology

## 2018-06-30 ENCOUNTER — Other Ambulatory Visit: Payer: Self-pay | Admitting: Cardiovascular Disease

## 2018-06-30 NOTE — Telephone Encounter (Signed)
SCR = 0.9 per [facility]

## 2018-07-01 ENCOUNTER — Ambulatory Visit (INDEPENDENT_AMBULATORY_CARE_PROVIDER_SITE_OTHER): Payer: Medicare Other | Admitting: Cardiology

## 2018-07-01 ENCOUNTER — Encounter: Payer: Self-pay | Admitting: Cardiology

## 2018-07-01 VITALS — BP 130/72 | HR 74 | Ht 67.0 in | Wt 142.0 lb

## 2018-07-01 DIAGNOSIS — I480 Paroxysmal atrial fibrillation: Secondary | ICD-10-CM | POA: Diagnosis not present

## 2018-07-01 DIAGNOSIS — I10 Essential (primary) hypertension: Secondary | ICD-10-CM | POA: Diagnosis not present

## 2018-07-01 DIAGNOSIS — Z7901 Long term (current) use of anticoagulants: Secondary | ICD-10-CM | POA: Diagnosis not present

## 2018-07-01 NOTE — Patient Instructions (Signed)
Medication Instructions:  Your physician recommends that you continue on your current medications as directed. Please refer to the Current Medication list given to you today. If you need a refill on your cardiac medications before your next appointment, please call your pharmacy.   Lab work: None  If you have labs (blood work) drawn today and your tests are completely normal, you will receive your results only by: Marland Kitchen MyChart Message (if you have MyChart) OR . A paper copy in the mail If you have any lab test that is abnormal or we need to change your treatment, we will call you to review the results.  Testing/Procedures: None   Follow-Up: At [facility], you and your health needs are our priority.  As part of our continuing mission to provide you with exceptional heart care, we have created designated Provider Care Teams.  These Care Teams include your primary Cardiologist (physician) and Advanced Practice Providers (APPs -  Physician Assistants and Nurse Practitioners) who all work together to provide you with the care you need, when you need it. You will need a follow up appointment in 12 months. Please call our office 2 months (November 2020) in advance to schedule this appointment.  You may see Dr Kirk Ruths or one of the following Advanced Practice Providers on your designated Care Team:   Kerin Ransom, PA-C Roby Lofts, [place] . Sande Rives, PA-C  Any Other Special Instructions Will Be Listed Below (If Applicable).

## 2018-07-01 NOTE — Progress Notes (Signed)
07/01/2018 [name]   [birth year]  [id number]  Primary Physician Thressa Sheller, MD (Inactive) Primary Cardiologist: Dr Sallyanne Kuster  HPI:  85 y.o.femalewith a history of PAF, dementia, and HTN with mild LVH by echo and low risk persantine Myoview in 2009. She is here for a one year f/u (LOV was Sept 2018 with me).  Her son says she has been doing well, no hospitalizations or illnesses. She has been on chronic Eliquis and my last office note indicates Dr Sallyanne Kuster had suggested she decrease this to 2.5 mg daily but its not clear this was ever done. The son that accompanied her today is not the son she lives with and he wasn't sure about her medications.  He di tel me that the pt's PCP is following the patients labs.     Current Outpatient Medications  Medication Sig Dispense Refill  . benazepril-hydrochlorthiazide (LOTENSIN HCT) 20-12.5 MG per tablet Take 1 tablet by mouth daily. 90 tablet 0  . Cholecalciferol (VITAMIN D-3) 1000 UNITS CAPS Take 1,000 Units by mouth daily.    . citalopram (CELEXA) 20 MG tablet Take 0.5 tablets by mouth daily.    Marland Kitchen ELIQUIS 5 MG TABS tablet TAKE 1 TABLET BY MOUTH TWICE DAILY 60 tablet 1  . levothyroxine (SYNTHROID, LEVOTHROID) 75 MCG tablet Take 75 mcg by mouth daily before breakfast.    . metoprolol succinate (TOPROL-XL) 25 MG 24 hr tablet Take 1 tablet (25 mg total) by mouth daily. ** DO NOT CRUSH **    (BETA BLOCKER) 90 tablet 3  . mirtazapine (REMERON) 15 MG tablet Take 1 tablet by mouth at bedtime.     No current facility-administered medications for this visit.     Allergies  Allergen Reactions  . Biaxin [Clarithromycin]   . Ceclor [Cefaclor]   . Phenobarbital   . Prednisone     Past Medical History:  Diagnosis Date  . H/O cardiovascular stress test 04/28/2000   normal images EF 80%  . H/O echocardiogram 01/28/2008   EF>55%, mild concentric LVH, right ventricular systolic pressure 21-19ERDE  . HTN (hypertension)   . Osteoporosis    . PAF (paroxysmal atrial fibrillation) (HCC)    occasional palpitations, on coumadin    Social History   Socioeconomic History  . Marital status: Married    Spouse name: Not on file  . Number of children: 2  . Years of education: Not on file  . Highest education level: Not on file  Occupational History  . Not on file  Social Needs  . Financial resource strain: Not on file  . Food insecurity:    Worry: Not on file    Inability: Not on file  . Transportation needs:    Medical: Not on file    Non-medical: Not on file  Tobacco Use  . Smoking status: Never Smoker  . Smokeless tobacco: Never Used  Substance and Sexual Activity  . Alcohol use: No  . Drug use: Not on file  . Sexual activity: Not on file  Lifestyle  . Physical activity:    Days per week: Not on file    Minutes per session: Not on file  . Stress: Not on file  Relationships  . Social connections:    Talks on phone: Not on file    Gets together: Not on file    Attends religious service: Not on file    Active member of club or organization: Not on file    Attends meetings of clubs or organizations:  Not on file    Relationship status: Not on file  . Intimate partner violence:    Fear of current or ex partner: Not on file    Emotionally abused: Not on file    Physically abused: Not on file    Forced sexual activity: Not on file  Other Topics Concern  . Not on file  Social History Narrative  . Not on file     Family History  Problem Relation Age of Onset  . Hypertension Father      Review of Systems: General: negative for chills, fever, night sweats or weight changes.  Cardiovascular: negative for chest pain, dyspnea on exertion, edema, orthopnea, palpitations, paroxysmal nocturnal dyspnea or shortness of breath Dermatological: negative for rash Respiratory: negative for cough or wheezing Urologic: negative for hematuria Abdominal: negative for nausea, vomiting, diarrhea, bright red blood per  rectum, melena, or hematemesis Neurologic: negative for visual changes, syncope, or dizziness All other systems reviewed and are otherwise negative except as noted above.    Blood pressure 130/72, pulse 74, height 5\' 7"  (1.702 m), weight 142 lb (64.4 kg).  General appearance: alert, cooperative, appears stated age and no distress Lungs: clear to auscultation bilaterally and scoliosis Heart: regular rate and rhythm Extremities: no edema Skin: pale, warm dry Neurologic: Grossly normal  EKG NSR, -74  ASSESSMENT AND PLAN:   PAF (paroxysmal atrial fibrillation) (HCC) In NSR today  Long term (current) use of anticoagulants Wgt is 64 kg, no recent renal function in chart- Dr Sallyanne Kuster had suggested she be on  Eliquis 2. 5 mg BID  HTN (hypertension) Controlled on current medications  PLAN  The pt's son will confirm medications and call us back. I'll try and get recent labs from her PCP. F/U one year.   Kerin Ransom PA-C 07/01/2018 3:54 PM

## 2018-07-04 NOTE — Progress Notes (Signed)
She qualifies for the lower dose Eliquis just based on weight and age, regardless of renal function. Should be on 2.5 mg BID MCr

## 2018-10-06 DIAGNOSIS — I10 Essential (primary) hypertension: Secondary | ICD-10-CM | POA: Diagnosis not present

## 2018-10-06 DIAGNOSIS — F339 Major depressive disorder, recurrent, unspecified: Secondary | ICD-10-CM | POA: Diagnosis not present

## 2018-10-06 DIAGNOSIS — G4700 Insomnia, unspecified: Secondary | ICD-10-CM | POA: Diagnosis not present

## 2018-10-06 DIAGNOSIS — M810 Age-related osteoporosis without current pathological fracture: Secondary | ICD-10-CM | POA: Diagnosis not present

## 2018-10-06 DIAGNOSIS — E039 Hypothyroidism, unspecified: Secondary | ICD-10-CM | POA: Diagnosis not present

## 2019-01-13 ENCOUNTER — Other Ambulatory Visit: Payer: Self-pay

## 2019-04-11 DIAGNOSIS — E559 Vitamin D deficiency, unspecified: Secondary | ICD-10-CM | POA: Diagnosis not present

## 2019-04-11 DIAGNOSIS — E039 Hypothyroidism, unspecified: Secondary | ICD-10-CM | POA: Diagnosis not present

## 2019-04-11 DIAGNOSIS — I129 Hypertensive chronic kidney disease with stage 1 through stage 4 chronic kidney disease, or unspecified chronic kidney disease: Secondary | ICD-10-CM | POA: Diagnosis not present

## 2019-04-11 DIAGNOSIS — E785 Hyperlipidemia, unspecified: Secondary | ICD-10-CM | POA: Diagnosis not present

## 2019-04-11 DIAGNOSIS — N390 Urinary tract infection, site not specified: Secondary | ICD-10-CM | POA: Diagnosis not present

## 2019-04-14 DIAGNOSIS — N390 Urinary tract infection, site not specified: Secondary | ICD-10-CM | POA: Diagnosis not present

## 2019-04-14 DIAGNOSIS — Z0000 Encounter for general adult medical examination without abnormal findings: Secondary | ICD-10-CM | POA: Diagnosis not present

## 2019-04-14 DIAGNOSIS — Z23 Encounter for immunization: Secondary | ICD-10-CM | POA: Diagnosis not present

## 2019-04-14 DIAGNOSIS — F5101 Primary insomnia: Secondary | ICD-10-CM | POA: Diagnosis not present

## 2019-04-14 DIAGNOSIS — E039 Hypothyroidism, unspecified: Secondary | ICD-10-CM | POA: Diagnosis not present

## 2019-04-14 DIAGNOSIS — M810 Age-related osteoporosis without current pathological fracture: Secondary | ICD-10-CM | POA: Diagnosis not present

## 2019-06-13 ENCOUNTER — Other Ambulatory Visit: Payer: Self-pay | Admitting: Cardiovascular Disease

## 2019-06-17 ENCOUNTER — Other Ambulatory Visit: Payer: Self-pay | Admitting: Cardiovascular Disease

## 2019-06-20 ENCOUNTER — Telehealth: Payer: Self-pay

## 2019-06-20 ENCOUNTER — Other Ambulatory Visit: Payer: Self-pay

## 2019-06-20 DIAGNOSIS — I480 Paroxysmal atrial fibrillation: Secondary | ICD-10-CM

## 2019-06-20 NOTE — Telephone Encounter (Signed)
LMOM for labs

## 2019-06-20 NOTE — Telephone Encounter (Signed)
REFILL RESPONDED TO ALREADY BY OTHER MEANS

## 2019-06-20 NOTE — Telephone Encounter (Signed)
REFILL SENT 

## 2019-09-10 ENCOUNTER — Other Ambulatory Visit: Payer: Self-pay | Admitting: Cardiovascular Disease

## 2019-10-14 DIAGNOSIS — I4891 Unspecified atrial fibrillation: Secondary | ICD-10-CM | POA: Diagnosis not present

## 2019-10-14 DIAGNOSIS — E039 Hypothyroidism, unspecified: Secondary | ICD-10-CM | POA: Diagnosis not present

## 2019-10-14 DIAGNOSIS — I10 Essential (primary) hypertension: Secondary | ICD-10-CM | POA: Diagnosis not present

## 2019-10-14 DIAGNOSIS — M810 Age-related osteoporosis without current pathological fracture: Secondary | ICD-10-CM | POA: Diagnosis not present

## 2019-11-23 ENCOUNTER — Other Ambulatory Visit: Payer: Self-pay | Admitting: Cardiovascular Disease

## 2019-12-18 ENCOUNTER — Other Ambulatory Visit: Payer: Self-pay | Admitting: Cardiovascular Disease

## 2020-01-23 ENCOUNTER — Other Ambulatory Visit: Payer: Self-pay | Admitting: Cardiovascular Disease

## 2020-04-25 ENCOUNTER — Other Ambulatory Visit: Payer: Self-pay | Admitting: Cardiovascular Disease

## 2020-04-25 DIAGNOSIS — E785 Hyperlipidemia, unspecified: Secondary | ICD-10-CM | POA: Diagnosis not present

## 2020-04-25 DIAGNOSIS — E039 Hypothyroidism, unspecified: Secondary | ICD-10-CM | POA: Diagnosis not present

## 2020-04-25 DIAGNOSIS — Z23 Encounter for immunization: Secondary | ICD-10-CM | POA: Diagnosis not present

## 2020-04-25 DIAGNOSIS — I129 Hypertensive chronic kidney disease with stage 1 through stage 4 chronic kidney disease, or unspecified chronic kidney disease: Secondary | ICD-10-CM | POA: Diagnosis not present

## 2020-04-25 DIAGNOSIS — R634 Abnormal weight loss: Secondary | ICD-10-CM | POA: Diagnosis not present

## 2020-04-25 DIAGNOSIS — I4891 Unspecified atrial fibrillation: Secondary | ICD-10-CM | POA: Diagnosis not present

## 2020-04-25 DIAGNOSIS — Z0000 Encounter for general adult medical examination without abnormal findings: Secondary | ICD-10-CM | POA: Diagnosis not present

## 2020-04-25 DIAGNOSIS — E559 Vitamin D deficiency, unspecified: Secondary | ICD-10-CM | POA: Diagnosis not present

## 2020-04-25 DIAGNOSIS — I10 Essential (primary) hypertension: Secondary | ICD-10-CM | POA: Diagnosis not present

## 2020-04-25 DIAGNOSIS — G4700 Insomnia, unspecified: Secondary | ICD-10-CM | POA: Diagnosis not present

## 2020-04-25 DIAGNOSIS — M810 Age-related osteoporosis without current pathological fracture: Secondary | ICD-10-CM | POA: Diagnosis not present

## 2020-05-22 ENCOUNTER — Other Ambulatory Visit: Payer: Self-pay | Admitting: Cardiovascular Disease

## 2020-06-04 ENCOUNTER — Other Ambulatory Visit: Payer: Self-pay

## 2020-06-04 MED ORDER — METOPROLOL SUCCINATE ER 25 MG PO TB24: 25.0000 mg | ORAL_TABLET | Freq: Every day | ORAL | 0 refills | Status: DC

## 2020-06-25 ENCOUNTER — Telehealth (INDEPENDENT_AMBULATORY_CARE_PROVIDER_SITE_OTHER): Payer: Medicare Other | Admitting: Cardiology

## 2020-06-25 ENCOUNTER — Encounter: Payer: Self-pay | Admitting: Cardiology

## 2020-06-25 VITALS — Ht 67.0 in

## 2020-06-25 DIAGNOSIS — F0390 Unspecified dementia without behavioral disturbance: Secondary | ICD-10-CM | POA: Diagnosis not present

## 2020-06-25 DIAGNOSIS — I480 Paroxysmal atrial fibrillation: Secondary | ICD-10-CM

## 2020-06-25 MED ORDER — APIXABAN 2.5 MG PO TABS: 2.5000 mg | ORAL_TABLET | Freq: Two times a day (BID) | ORAL | 3 refills | Status: AC

## 2020-06-25 MED ORDER — METOPROLOL SUCCINATE ER 25 MG PO TB24: 25.0000 mg | ORAL_TABLET | Freq: Every day | ORAL | 0 refills | Status: DC

## 2020-06-25 NOTE — Patient Instructions (Signed)
Medication Instructions:  Your Physician recommend you continue on your current medication as directed.    *If you need a refill on your cardiac medications before your next appointment, please call your pharmacy*   Lab Work: None   Testing/Procedures: None  Follow-Up: At [facility], you and your health needs are our priority.  As part of our continuing mission to provide you with exceptional heart care, we have created designated Provider Care Teams.  These Care Teams include your primary Cardiologist (physician) and Advanced Practice Providers (APPs -  Physician Assistants and Nurse Practitioners) who all work together to provide you with the care you need, when you need it.  We recommend signing up for the patient portal called "MyChart".  Sign up information is provided on this After Visit Summary.  MyChart is used to connect with patients for Virtual Visits (Telemedicine).  Patients are able to view lab/test results, encounter notes, upcoming appointments, etc.  Non-urgent messages can be sent to your provider as well.   To learn more about what you can do with MyChart, go to [email or web address].    Your next appointment:   1 year  The format for your next appointment:   In Person  Provider:   Sanda Klein, MD

## 2020-06-25 NOTE — Progress Notes (Signed)
Virtual Visit via Telephone Note   This visit type was conducted due to national recommendations for restrictions regarding the COVID-19 Pandemic (e.g. social distancing) in an effort to limit this patient's exposure and mitigate transmission in our community.  Due to her co-morbid illnesses, this patient is at least at moderate risk for complications without adequate follow up.  This format is felt to be most appropriate for this patient at this time.  The patient did not have access to video technology/had technical difficulties with video requiring transitioning to audio format only (telephone).  All issues noted in this document were discussed and addressed.  No physical exam could be performed with this format.  Please refer to the patient's chart for her  consent to telehealth for [name].    Date:  06/25/2020   ID:  [name], DOB February 04, 1923, MRN [id number] The patient was identified using 2 identifiers.  Patient Location: Home Provider Location: Home Office  PCP:  Thressa Sheller, MD (Inactive)  Cardiologist:  Dr Sallyanne Kuster Electrophysiologist:  None   Evaluation Performed:  Follow-Up Visit  Chief Complaint:  none  History of Present Illness:    [name] is a 87 y.o. female with PAF, dementia, and HTN with mild LVH by echo and low risk persantine Myoview in 2009. She is here for a one year f/u (LOV was Sept 2018 with me).  Her son says she has been doing well, no hospitalizations or illnesses. She has been on chronic Eliquis, Dr Sallyanne Kuster has suggested she decrease this to 2.5 mg daily.   She was contacted today for a routine follow-up.  I spoke with her son on the phone.  He takes care of [name].  The patient has significant dementia.  He says she frequently is up at night wandering the halls.  From a heart standpoint though she seems to be doing well.  He says she gets around the house with no problem.  She has not had any issues with her  medications.  She does follow-up at [facility] regularly.   The patient does not have symptoms concerning for COVID-19 infection (fever, chills, cough, or new shortness of breath).    Past Medical History:  Diagnosis Date  . H/O cardiovascular stress test 04/28/2000   normal images EF 80%  . H/O echocardiogram 01/28/2008   EF>55%, mild concentric LVH, right ventricular systolic pressure 25-42HCWC  . HTN (hypertension)   . Osteoporosis   . PAF (paroxysmal atrial fibrillation) (HCC)    occasional palpitations, on coumadin   No past surgical history on file.   Current Meds  Medication Sig  . benazepril-hydrochlorthiazide (LOTENSIN HCT) 20-12.5 MG per tablet Take 1 tablet by mouth daily.  . Cholecalciferol (VITAMIN D-3) 1000 UNITS CAPS Take 1,000 Units by mouth daily.  . citalopram (CELEXA) 20 MG tablet Take 0.5 tablets by mouth daily.  Marland Kitchen denosumab (PROLIA) 60 MG/ML SOSY injection See admin instructions.  Marland Kitchen ELIQUIS 5 MG TABS tablet Take 1 tablet by mouth twice daily  . levothyroxine (SYNTHROID, LEVOTHROID) 75 MCG tablet Take 75 mcg by mouth daily before breakfast.  . melatonin 3 MG TABS tablet 1 tablet at bedtime as needed  . metoprolol succinate (TOPROL-XL) 25 MG 24 hr tablet Take 1 tablet (25 mg total) by mouth daily. Patient needs an appointment for future refills.  . mirtazapine (REMERON) 15 MG tablet Take 1 tablet by mouth at bedtime.     Allergies:   Biaxin [clarithromycin], Ceclor [cefaclor], Phenobarbital, and  Prednisone   Social History   Tobacco Use  . Smoking status: Never Smoker  . Smokeless tobacco: Never Used  Substance Use Topics  . Alcohol use: No     Family Hx: The patient's family history includes Hypertension in her father.  ROS:   Please see the history of present illness.    All other systems reviewed and are negative.   Prior CV studies:   The following studies were reviewed today:    Labs/Other Tests and Data Reviewed:    EKG:  An ECG  dated 06/30/2018 was personally reviewed today and demonstrated:  NSR, HR 74  Recent Labs: No results found for requested labs within last 8760 hours.   Recent Lipid Panel No results found for: CHOL, TRIG, HDL, CHOLHDL, LDLCALC, LDLDIRECT  Wt Readings from Last 3 Encounters:  07/01/18 142 lb (64.4 kg)  03/10/17 145 lb (65.8 kg)  02/15/16 140 lb 9.6 oz (63.8 kg)     Risk Assessment/Calculations:      Objective:    Vital Signs:  Ht 5\' 7"  (1.702 m)   BMI 22.24 kg/m    VITAL SIGNS:  reviewed  ASSESSMENT & PLAN:    PAF- No symptoms to suggest recurrent PAF.  She is on Eliquis 2.5 mg BID (he is breaking her 5 mg tablet in half).  Dementia- Pt is cared for by her son     Plan:  I will refill Toprol and Eliquis 2.5 mg BID.  Office visit in one year since we are managing these medications.    COVID-19 Education: The signs and symptoms of COVID-19 were discussed with the patient and how to seek care for testing (follow up with PCP or arrange E-visit).  The importance of social distancing was discussed today.  Time:   Today, I have spent 10 minutes with the patient with telehealth technology discussing the above problems.     Medication Adjustments/Labs and Tests Ordered: Current medicines are reviewed at length with the patient today.  Concerns regarding medicines are outlined above.   Tests Ordered: No orders of the defined types were placed in this encounter.   Medication Changes: No orders of the defined types were placed in this encounter.   Follow Up:  In Person Dr Sallyanne Kuster in one year  Signed, Kerin Ransom, Hershal Coria  06/25/2020 10:51 AM    [place]

## 2020-07-31 ENCOUNTER — Other Ambulatory Visit: Payer: Self-pay | Admitting: Cardiology

## 2020-08-23 ENCOUNTER — Other Ambulatory Visit: Payer: Self-pay | Admitting: Cardiology

## 2020-09-21 ENCOUNTER — Other Ambulatory Visit: Payer: Self-pay | Admitting: Cardiovascular Disease

## 2020-10-25 DIAGNOSIS — F329 Major depressive disorder, single episode, unspecified: Secondary | ICD-10-CM | POA: Diagnosis not present

## 2020-10-25 DIAGNOSIS — E039 Hypothyroidism, unspecified: Secondary | ICD-10-CM | POA: Diagnosis not present

## 2020-10-25 DIAGNOSIS — I10 Essential (primary) hypertension: Secondary | ICD-10-CM | POA: Diagnosis not present

## 2020-10-25 DIAGNOSIS — G4700 Insomnia, unspecified: Secondary | ICD-10-CM | POA: Diagnosis not present

## 2020-10-25 DIAGNOSIS — E559 Vitamin D deficiency, unspecified: Secondary | ICD-10-CM | POA: Diagnosis not present

## 2020-10-25 DIAGNOSIS — M810 Age-related osteoporosis without current pathological fracture: Secondary | ICD-10-CM | POA: Diagnosis not present

## 2020-11-15 DIAGNOSIS — F5101 Primary insomnia: Secondary | ICD-10-CM | POA: Diagnosis not present

## 2021-05-01 DIAGNOSIS — E039 Hypothyroidism, unspecified: Secondary | ICD-10-CM | POA: Diagnosis not present

## 2021-05-01 DIAGNOSIS — Z23 Encounter for immunization: Secondary | ICD-10-CM | POA: Diagnosis not present

## 2021-05-01 DIAGNOSIS — I10 Essential (primary) hypertension: Secondary | ICD-10-CM | POA: Diagnosis not present

## 2021-05-01 DIAGNOSIS — Z0000 Encounter for general adult medical examination without abnormal findings: Secondary | ICD-10-CM | POA: Diagnosis not present

## 2021-05-01 DIAGNOSIS — I4891 Unspecified atrial fibrillation: Secondary | ICD-10-CM | POA: Diagnosis not present

## 2021-05-01 DIAGNOSIS — M810 Age-related osteoporosis without current pathological fracture: Secondary | ICD-10-CM | POA: Diagnosis not present

## 2021-05-01 DIAGNOSIS — F5101 Primary insomnia: Secondary | ICD-10-CM | POA: Diagnosis not present

## 2021-05-01 DIAGNOSIS — R634 Abnormal weight loss: Secondary | ICD-10-CM | POA: Diagnosis not present

## 2021-08-01 DIAGNOSIS — Z23 Encounter for immunization: Secondary | ICD-10-CM | POA: Diagnosis not present

## 2021-08-23 ENCOUNTER — Other Ambulatory Visit: Payer: Self-pay | Admitting: Cardiovascular Disease

## 2021-08-26 DIAGNOSIS — Z23 Encounter for immunization: Secondary | ICD-10-CM | POA: Diagnosis not present

## 2021-09-10 DIAGNOSIS — E559 Vitamin D deficiency, unspecified: Secondary | ICD-10-CM | POA: Diagnosis not present

## 2021-09-10 DIAGNOSIS — F5101 Primary insomnia: Secondary | ICD-10-CM | POA: Diagnosis not present

## 2021-09-10 DIAGNOSIS — E039 Hypothyroidism, unspecified: Secondary | ICD-10-CM | POA: Diagnosis not present

## 2021-09-10 DIAGNOSIS — Z0289 Encounter for other administrative examinations: Secondary | ICD-10-CM | POA: Diagnosis not present

## 2021-09-10 DIAGNOSIS — F02C0 Dementia in other diseases classified elsewhere, severe, without behavioral disturbance, psychotic disturbance, mood disturbance, and anxiety: Secondary | ICD-10-CM | POA: Diagnosis not present

## 2021-09-10 DIAGNOSIS — G3109 Other frontotemporal dementia: Secondary | ICD-10-CM | POA: Diagnosis not present

## 2021-09-10 DIAGNOSIS — I129 Hypertensive chronic kidney disease with stage 1 through stage 4 chronic kidney disease, or unspecified chronic kidney disease: Secondary | ICD-10-CM | POA: Diagnosis not present

## 2021-09-10 DIAGNOSIS — F339 Major depressive disorder, recurrent, unspecified: Secondary | ICD-10-CM | POA: Diagnosis not present

## 2021-09-10 DIAGNOSIS — E785 Hyperlipidemia, unspecified: Secondary | ICD-10-CM | POA: Diagnosis not present

## 2021-09-10 DIAGNOSIS — F419 Anxiety disorder, unspecified: Secondary | ICD-10-CM | POA: Diagnosis not present

## 2021-09-10 DIAGNOSIS — I4891 Unspecified atrial fibrillation: Secondary | ICD-10-CM | POA: Diagnosis not present

## 2021-09-27 DIAGNOSIS — E034 Atrophy of thyroid (acquired): Secondary | ICD-10-CM | POA: Diagnosis not present

## 2021-09-27 DIAGNOSIS — I4891 Unspecified atrial fibrillation: Secondary | ICD-10-CM | POA: Diagnosis not present

## 2021-09-27 DIAGNOSIS — E46 Unspecified protein-calorie malnutrition: Secondary | ICD-10-CM | POA: Diagnosis not present

## 2021-09-27 DIAGNOSIS — G309 Alzheimer's disease, unspecified: Secondary | ICD-10-CM | POA: Diagnosis not present

## 2021-09-27 DIAGNOSIS — F0280 Dementia in other diseases classified elsewhere without behavioral disturbance: Secondary | ICD-10-CM | POA: Diagnosis not present

## 2021-09-30 DIAGNOSIS — I10 Essential (primary) hypertension: Secondary | ICD-10-CM | POA: Diagnosis not present

## 2021-10-02 DIAGNOSIS — F330 Major depressive disorder, recurrent, mild: Secondary | ICD-10-CM | POA: Diagnosis not present

## 2021-10-02 DIAGNOSIS — F5101 Primary insomnia: Secondary | ICD-10-CM | POA: Diagnosis not present

## 2021-10-02 DIAGNOSIS — F411 Generalized anxiety disorder: Secondary | ICD-10-CM | POA: Diagnosis not present

## 2021-10-02 DIAGNOSIS — F0390 Unspecified dementia without behavioral disturbance: Secondary | ICD-10-CM | POA: Diagnosis not present

## 2021-10-12 DIAGNOSIS — I10 Essential (primary) hypertension: Secondary | ICD-10-CM | POA: Diagnosis not present

## 2021-10-12 DIAGNOSIS — E46 Unspecified protein-calorie malnutrition: Secondary | ICD-10-CM | POA: Diagnosis not present

## 2021-10-12 DIAGNOSIS — I4891 Unspecified atrial fibrillation: Secondary | ICD-10-CM | POA: Diagnosis not present

## 2021-10-15 DIAGNOSIS — R1312 Dysphagia, oropharyngeal phase: Secondary | ICD-10-CM | POA: Diagnosis not present

## 2021-10-15 DIAGNOSIS — F0390 Unspecified dementia without behavioral disturbance: Secondary | ICD-10-CM | POA: Diagnosis not present

## 2021-10-15 DIAGNOSIS — R1310 Dysphagia, unspecified: Secondary | ICD-10-CM | POA: Diagnosis not present

## 2021-10-15 DIAGNOSIS — R293 Abnormal posture: Secondary | ICD-10-CM | POA: Diagnosis not present

## 2021-10-16 DIAGNOSIS — R293 Abnormal posture: Secondary | ICD-10-CM | POA: Diagnosis not present

## 2021-10-16 DIAGNOSIS — R1310 Dysphagia, unspecified: Secondary | ICD-10-CM | POA: Diagnosis not present

## 2021-10-16 DIAGNOSIS — R1312 Dysphagia, oropharyngeal phase: Secondary | ICD-10-CM | POA: Diagnosis not present

## 2021-10-16 DIAGNOSIS — F0390 Unspecified dementia without behavioral disturbance: Secondary | ICD-10-CM | POA: Diagnosis not present

## 2021-10-17 DIAGNOSIS — R293 Abnormal posture: Secondary | ICD-10-CM | POA: Diagnosis not present

## 2021-10-17 DIAGNOSIS — R1312 Dysphagia, oropharyngeal phase: Secondary | ICD-10-CM | POA: Diagnosis not present

## 2021-10-17 DIAGNOSIS — R1310 Dysphagia, unspecified: Secondary | ICD-10-CM | POA: Diagnosis not present

## 2021-10-17 DIAGNOSIS — F0390 Unspecified dementia without behavioral disturbance: Secondary | ICD-10-CM | POA: Diagnosis not present

## 2021-10-18 DIAGNOSIS — R1310 Dysphagia, unspecified: Secondary | ICD-10-CM | POA: Diagnosis not present

## 2021-10-18 DIAGNOSIS — F0390 Unspecified dementia without behavioral disturbance: Secondary | ICD-10-CM | POA: Diagnosis not present

## 2021-10-18 DIAGNOSIS — R1312 Dysphagia, oropharyngeal phase: Secondary | ICD-10-CM | POA: Diagnosis not present

## 2021-10-18 DIAGNOSIS — R293 Abnormal posture: Secondary | ICD-10-CM | POA: Diagnosis not present

## 2021-10-21 DIAGNOSIS — F0390 Unspecified dementia without behavioral disturbance: Secondary | ICD-10-CM | POA: Diagnosis not present

## 2021-10-21 DIAGNOSIS — R293 Abnormal posture: Secondary | ICD-10-CM | POA: Diagnosis not present

## 2021-10-21 DIAGNOSIS — R1310 Dysphagia, unspecified: Secondary | ICD-10-CM | POA: Diagnosis not present

## 2021-10-21 DIAGNOSIS — R1312 Dysphagia, oropharyngeal phase: Secondary | ICD-10-CM | POA: Diagnosis not present

## 2021-10-22 DIAGNOSIS — R1312 Dysphagia, oropharyngeal phase: Secondary | ICD-10-CM | POA: Diagnosis not present

## 2021-10-22 DIAGNOSIS — R293 Abnormal posture: Secondary | ICD-10-CM | POA: Diagnosis not present

## 2021-10-22 DIAGNOSIS — F0390 Unspecified dementia without behavioral disturbance: Secondary | ICD-10-CM | POA: Diagnosis not present

## 2021-10-22 DIAGNOSIS — R1310 Dysphagia, unspecified: Secondary | ICD-10-CM | POA: Diagnosis not present

## 2021-10-28 DIAGNOSIS — R1310 Dysphagia, unspecified: Secondary | ICD-10-CM | POA: Diagnosis not present

## 2021-10-28 DIAGNOSIS — R293 Abnormal posture: Secondary | ICD-10-CM | POA: Diagnosis not present

## 2021-10-28 DIAGNOSIS — F0390 Unspecified dementia without behavioral disturbance: Secondary | ICD-10-CM | POA: Diagnosis not present

## 2021-10-28 DIAGNOSIS — R1312 Dysphagia, oropharyngeal phase: Secondary | ICD-10-CM | POA: Diagnosis not present

## 2021-10-29 DIAGNOSIS — F0390 Unspecified dementia without behavioral disturbance: Secondary | ICD-10-CM | POA: Diagnosis not present

## 2021-10-29 DIAGNOSIS — R1312 Dysphagia, oropharyngeal phase: Secondary | ICD-10-CM | POA: Diagnosis not present

## 2021-10-29 DIAGNOSIS — R293 Abnormal posture: Secondary | ICD-10-CM | POA: Diagnosis not present

## 2021-10-29 DIAGNOSIS — R1310 Dysphagia, unspecified: Secondary | ICD-10-CM | POA: Diagnosis not present

## 2021-10-31 DIAGNOSIS — F0390 Unspecified dementia without behavioral disturbance: Secondary | ICD-10-CM | POA: Diagnosis not present

## 2021-10-31 DIAGNOSIS — R1312 Dysphagia, oropharyngeal phase: Secondary | ICD-10-CM | POA: Diagnosis not present

## 2021-10-31 DIAGNOSIS — R1310 Dysphagia, unspecified: Secondary | ICD-10-CM | POA: Diagnosis not present

## 2021-10-31 DIAGNOSIS — R293 Abnormal posture: Secondary | ICD-10-CM | POA: Diagnosis not present

## 2021-11-07 DIAGNOSIS — F0390 Unspecified dementia without behavioral disturbance: Secondary | ICD-10-CM | POA: Diagnosis not present

## 2021-11-07 DIAGNOSIS — R1310 Dysphagia, unspecified: Secondary | ICD-10-CM | POA: Diagnosis not present

## 2021-11-07 DIAGNOSIS — R293 Abnormal posture: Secondary | ICD-10-CM | POA: Diagnosis not present

## 2021-11-07 DIAGNOSIS — R1312 Dysphagia, oropharyngeal phase: Secondary | ICD-10-CM | POA: Diagnosis not present

## 2021-11-12 DIAGNOSIS — R1312 Dysphagia, oropharyngeal phase: Secondary | ICD-10-CM | POA: Diagnosis not present

## 2021-11-12 DIAGNOSIS — R1310 Dysphagia, unspecified: Secondary | ICD-10-CM | POA: Diagnosis not present

## 2021-11-12 DIAGNOSIS — R293 Abnormal posture: Secondary | ICD-10-CM | POA: Diagnosis not present

## 2021-11-12 DIAGNOSIS — F0390 Unspecified dementia without behavioral disturbance: Secondary | ICD-10-CM | POA: Diagnosis not present

## 2021-11-18 DIAGNOSIS — R1312 Dysphagia, oropharyngeal phase: Secondary | ICD-10-CM | POA: Diagnosis not present

## 2021-11-18 DIAGNOSIS — F0390 Unspecified dementia without behavioral disturbance: Secondary | ICD-10-CM | POA: Diagnosis not present

## 2021-11-18 DIAGNOSIS — R1310 Dysphagia, unspecified: Secondary | ICD-10-CM | POA: Diagnosis not present

## 2021-12-09 DIAGNOSIS — F330 Major depressive disorder, recurrent, mild: Secondary | ICD-10-CM | POA: Diagnosis not present

## 2021-12-09 DIAGNOSIS — F5101 Primary insomnia: Secondary | ICD-10-CM | POA: Diagnosis not present

## 2021-12-09 DIAGNOSIS — F411 Generalized anxiety disorder: Secondary | ICD-10-CM | POA: Diagnosis not present

## 2021-12-09 DIAGNOSIS — F03C11 Unspecified dementia, severe, with agitation: Secondary | ICD-10-CM | POA: Diagnosis not present

## 2021-12-25 DIAGNOSIS — F330 Major depressive disorder, recurrent, mild: Secondary | ICD-10-CM | POA: Diagnosis not present

## 2021-12-25 DIAGNOSIS — F03C11 Unspecified dementia, severe, with agitation: Secondary | ICD-10-CM | POA: Diagnosis not present

## 2021-12-25 DIAGNOSIS — F5101 Primary insomnia: Secondary | ICD-10-CM | POA: Diagnosis not present

## 2024-02-15 DEATH — deceased
